# Patient Record
Sex: Female | Born: 1970 | Race: White | Hispanic: No | Marital: Married | Smoking: Never smoker
Health system: Southern US, Community
[De-identification: ages and names within clinical notes are randomized; demographics above are authoritative.]

## PROBLEM LIST (undated history)

## (undated) HISTORY — PX: OTHER SURGICAL HISTORY: SHX169

---

## 2011-07-05 HISTORY — PX: DILATION AND CURETTAGE OF UTERUS: SHX78

## 2012-11-01 DIAGNOSIS — O021 Missed abortion: Secondary | ICD-10-CM | POA: Insufficient documentation

## 2013-05-17 DIAGNOSIS — R002 Palpitations: Secondary | ICD-10-CM | POA: Insufficient documentation

## 2013-05-17 DIAGNOSIS — K219 Gastro-esophageal reflux disease without esophagitis: Secondary | ICD-10-CM | POA: Insufficient documentation

## 2013-05-17 DIAGNOSIS — Z3490 Encounter for supervision of normal pregnancy, unspecified, unspecified trimester: Secondary | ICD-10-CM | POA: Insufficient documentation

## 2014-08-12 ENCOUNTER — Emergency Department: Payer: Self-pay | Admitting: Student

## 2014-09-26 ENCOUNTER — Emergency Department: Payer: Self-pay | Admitting: Emergency Medicine

## 2015-04-08 ENCOUNTER — Encounter: Payer: Self-pay | Admitting: *Deleted

## 2015-04-08 DIAGNOSIS — N200 Calculus of kidney: Secondary | ICD-10-CM | POA: Insufficient documentation

## 2015-04-09 ENCOUNTER — Ambulatory Visit: Payer: Self-pay | Admitting: Obstetrics and Gynecology

## 2015-04-24 ENCOUNTER — Encounter: Payer: Self-pay | Admitting: Obstetrics and Gynecology

## 2015-04-24 ENCOUNTER — Ambulatory Visit: Payer: Self-pay | Admitting: Obstetrics and Gynecology

## 2015-09-11 ENCOUNTER — Encounter: Payer: Self-pay | Admitting: Emergency Medicine

## 2015-09-11 DIAGNOSIS — Z3202 Encounter for pregnancy test, result negative: Secondary | ICD-10-CM | POA: Insufficient documentation

## 2015-09-11 DIAGNOSIS — R1030 Lower abdominal pain, unspecified: Secondary | ICD-10-CM | POA: Diagnosis present

## 2015-09-11 DIAGNOSIS — N3090 Cystitis, unspecified without hematuria: Secondary | ICD-10-CM | POA: Insufficient documentation

## 2015-09-11 LAB — COMPREHENSIVE METABOLIC PANEL
ALBUMIN: 3.7 g/dL (ref 3.5–5.0)
ALK PHOS: 92 U/L (ref 38–126)
ALT: 26 U/L (ref 14–54)
AST: 28 U/L (ref 15–41)
Anion gap: 6 (ref 5–15)
BUN: 12 mg/dL (ref 6–20)
CALCIUM: 8.8 mg/dL — AB (ref 8.9–10.3)
CHLORIDE: 107 mmol/L (ref 101–111)
CO2: 24 mmol/L (ref 22–32)
CREATININE: 0.83 mg/dL (ref 0.44–1.00)
GFR calc non Af Amer: >60 mL/min (ref >60)
GLUCOSE: 136 mg/dL — AB (ref 65–99)
Potassium: 3.6 mmol/L (ref 3.5–5.1)
SODIUM: 137 mmol/L (ref 135–145)
Total Bilirubin: 0.5 mg/dL (ref 0.3–1.2)
Total Protein: 7.4 g/dL (ref 6.5–8.1)

## 2015-09-11 LAB — URINALYSIS COMPLETE WITH MICROSCOPIC (ARMC ONLY)
BILIRUBIN URINE: NEGATIVE
GLUCOSE, UA: NEGATIVE mg/dL
Ketones, ur: NEGATIVE mg/dL
Leukocytes, UA: NEGATIVE
Nitrite: NEGATIVE
Protein, ur: NEGATIVE mg/dL
Specific Gravity, Urine: 1.002 — ABNORMAL LOW (ref 1.005–1.030)
pH: 6 (ref 5.0–8.0)

## 2015-09-11 LAB — LIPASE, BLOOD: LIPASE: 34 U/L (ref 11–51)

## 2015-09-11 LAB — CBC
HEMATOCRIT: 37.9 % (ref 35.0–47.0)
HEMOGLOBIN: 13 g/dL (ref 12.0–16.0)
MCH: 29.1 pg (ref 26.0–34.0)
MCHC: 34.4 g/dL (ref 32.0–36.0)
MCV: 84.6 fL (ref 80.0–100.0)
Platelets: 290 10*3/uL (ref 150–440)
RBC: 4.48 MIL/uL (ref 3.80–5.20)
RDW: 14 % (ref 11.5–14.5)
WBC: 8.8 10*3/uL (ref 3.6–11.0)

## 2015-09-11 NOTE — ED Notes (Signed)
Pt. States she has been seen recently for UTI like symptoms.  Pt. States she was advised to see urologist, but does not have appt. Till next week.  Pt. States she had been having lower abdominal spasms that radiated to back.  Pt. States today, burning sensation while urinating.   Pt. States dx of kidney stones last year.

## 2015-09-12 ENCOUNTER — Emergency Department
Admission: EM | Admit: 2015-09-12 | Discharge: 2015-09-12 | Disposition: A | Discharge status: Home / Self Care | Payer: Federal, State, Local not specified - PPO | Attending: Emergency Medicine | Admitting: Emergency Medicine

## 2015-09-12 DIAGNOSIS — N3090 Cystitis, unspecified without hematuria: Secondary | ICD-10-CM

## 2015-09-12 LAB — PREGNANCY, URINE: PREG TEST UR: NEGATIVE

## 2015-09-12 MED ORDER — PHENAZOPYRIDINE HCL 95 MG PO TABS: 190.0000 mg | ORAL_TABLET | Freq: Three times a day (TID) | ORAL | Status: DC | PRN

## 2015-09-12 NOTE — Discharge Instructions (Signed)
As we discussed, your workup today was reassuring.  We believe he would benefit from follow-up with a urologist.  We have sent a message to [facility]olin[facility] at [facility].  If you have not heard from her office by Tuesday, please call to see if he can schedule an appointment for this coming week.  Try taking the prescribed medication for your bladder discomfort as well as over-the-counter ibuprofen and/or Tylenol according to label instructions.  Return to the emergency department with new or worsening symptoms that concern you.

## 2015-09-12 NOTE — ED Provider Notes (Signed)
[facility] Emergency Department Provider Note  ____________________________________________  Time seen: Approximately 1:30 AM  I have reviewed the triage vital signs and the nursing notes.   HISTORY  Chief Complaint Abdominal Pain    HPI Lindsey Gillespie is a 45 y.o. female who has a history of hematuria who presents with suprapubic or pelvic pain.  This is been going on for several weeks and she has seen her outpatient doctor and also has an appointment scheduled next week to see her primary care doctor again.  She has not been able to see a urologist.  She reports acute onset of severe suprapubic discomfort tonight that she describes as a pressure and irritation but not sharp or stabbing pain or burning.  Denies N/V.  No flank pain.  Denies fever/chills, CP, SOB.  Denies vaginal symptoms.  Nothing makes better or worse.    History reviewed. No pertinent past medical history.  Patient Active Problem List   Diagnosis Date Noted  . Calculus of kidney 04/08/2015    Past Surgical History  Procedure Laterality Date  . Miscarriage      Current Outpatient Rx  Name  Route  Sig  Dispense  Refill  . phenazopyridine (PYRIDIUM) 95 MG tablet   Oral   Take 2 tablets (190 mg total) by mouth 3 (three) times daily with meals as needed for pain (bladder pain/spasms).   12 tablet   0     Allergies Metronidazole and Tape  No family history on file.  Social History Social History  Substance Use Topics  . Smoking status: Never Smoker   . Smokeless tobacco: None  . Alcohol Use: No    Review of Systems Constitutional: No fever/chills Eyes: No visual changes. ENT: No sore throat. Cardiovascular: Denies chest pain. Respiratory: Denies shortness of breath. Gastrointestinal: Suprapubic pain Genitourinary: Negative for dysuria.  H/o hematuria Musculoskeletal: Negative for back pain. Skin: Negative for rash. Neurological: Negative for headaches, focal weakness  or numbness.  10-point ROS otherwise negative.  ____________________________________________   PHYSICAL EXAM:  VITAL SIGNS: ED Triage Vitals  Enc Vitals Group     BP 09/11/15 2128 131/91 mmHg     Pulse Rate 09/11/15 2128 77     Resp 09/11/15 2128 16     Temp 09/11/15 2128 98.3 F (36.8 C)     Temp src --      SpO2 09/11/15 2128 99 %     Weight 09/11/15 2128 196 lb (88.905 kg)     Height 09/11/15 2128  (1.626 m)     Head Cir --      Peak Flow --      Pain Score 09/11/15 2130 8     Pain Loc --      Pain Edu? --      Excl. in GC? --     Constitutional: Alert and oriented. Well appearing and in no acute distress. Eyes: Conjunctivae are normal. PERRL. EOMI. Head: Atraumatic. Nose: No congestion/rhinnorhea. Mouth/Throat: Mucous membranes are moist.  Oropharynx non-erythematous. Neck: No stridor.  No meningeal signs.   Cardiovascular: Normal rate, regular rhythm. Good peripheral circulation. Grossly normal heart sounds.   Respiratory: Normal respiratory effort.  No retractions. Lungs CTAB. Gastrointestinal: Soft and nontender. No distention.  GU:  Declined by patient Musculoskeletal: No lower extremity tenderness nor edema. No gross deformities of extremities. Neurologic:  Normal speech and language. No gross focal neurologic deficits are appreciated.  Skin:  Skin is warm, dry and intact. No rash noted.  ____________________________________________   LABS (all labs ordered are listed, but only abnormal results are displayed)  Labs Reviewed  COMPREHENSIVE METABOLIC PANEL - Abnormal; Notable for the following:    Glucose, Bld 136 (*)    Calcium 8.8 (*)    All other components within normal limits  URINALYSIS COMPLETEWITH MICROSCOPIC ([facility] ONLY) - Abnormal; Notable for the following:    Color, Urine STRAW (*)    APPearance CLEAR (*)    Specific Gravity, Urine 1.002 (*)    Hgb urine dipstick 3+ (*)    Bacteria, UA RARE (*)    Squamous Epithelial / LPF 0-5 (*)     All other components within normal limits  URINE CULTURE  LIPASE, BLOOD  CBC  PREGNANCY, URINE   ____________________________________________  EKG  ED ECG REPORT I, Anterio Scheel, the attending physician, personally viewed and interpreted this ECG.  Date: 09/12/2015 EKG Time: 21:27 Rate: 78 Rhythm: normal sinus rhythm QRS Axis: normal Intervals: normal ST/T Wave abnormalities: normal Conduction Disturbances: none Narrative Interpretation: unremarkable  ____________________________________________  RADIOLOGY   No results found.  ____________________________________________   PROCEDURES  Procedure(s) performed: None  Critical Care performed: No ____________________________________________   INITIAL IMPRESSION / ASSESSMENT AND PLAN / ED COURSE  Pertinent labs & imaging results that were available during my care of the patient were reviewed by me and considered in my medical decision making (see chart for details).  Signs/symptoms most consistent with inflammatory (rather than infectious) cystitis.  UA unremarkable except for Hgb.   Sent urine culture to be sure.  Discussed pelvic, which patient declined.  Also discussed CT renal study protocol, but I explained I did not think it was necessary, and she agreed.  I discussed cystitis and explained why I thought f/u w/ urology would be the best option, and I [facility] to Dr. Apolinar JunesBrandon to facilitate f/u.  Rxs as listed below.    I gave my usual and customary return precautions.     ____________________________________________  FINAL CLINICAL IMPRESSION(S) / ED DIAGNOSES  Final diagnoses:  Cystitis      NEW MEDICATIONS STARTED DURING THIS VISIT:  Discharge Medication List as of 09/12/2015  1:53 AM    START taking these medications   Details  phenazopyridine (PYRIDIUM) 95 MG tablet Take 2 tablets (190 mg total) by mouth 3 (three) times daily with meals as needed for pain (bladder pain/spasms).,  Starting 09/12/2015, Until Discontinued, Print          Note:  This document was prepared using Dragon voice recognition software and may include unintentional dictation errors.   Loleta Roseory Avenly Roberge, MD 09/12/15 [phone]

## 2015-09-12 NOTE — ED Notes (Signed)
Pt's husband called after pt's discharge, states pt is doubled over in pain.  Dr. York CeriseForbach consulted by this nurse.  Based on Dr. York CeriseForbach feedback, pt's husband told pain control options at home include OTC tylenol, ibuprofen, or Azo.  Husband told he can bring pt back to ED if pt feels that is necessary, but that decision cannot be made by Dr. York CeriseForbach or this nurse.

## 2015-09-14 LAB — URINE CULTURE
CULTURE: NO GROWTH
Special Requests: NORMAL

## 2015-10-02 ENCOUNTER — Encounter: Payer: Self-pay | Admitting: Urology

## 2015-10-02 ENCOUNTER — Ambulatory Visit (INDEPENDENT_AMBULATORY_CARE_PROVIDER_SITE_OTHER): Payer: Federal, State, Local not specified - PPO | Admitting: Urology

## 2015-10-02 VITALS — BP 110/77 | HR 76 | Ht 64.0 in | Wt 200.8 lb

## 2015-10-02 DIAGNOSIS — N200 Calculus of kidney: Secondary | ICD-10-CM

## 2015-10-02 DIAGNOSIS — F32A Depression, unspecified: Secondary | ICD-10-CM | POA: Insufficient documentation

## 2015-10-02 DIAGNOSIS — R102 Pelvic and perineal pain: Secondary | ICD-10-CM | POA: Diagnosis not present

## 2015-10-02 DIAGNOSIS — F329 Major depressive disorder, single episode, unspecified: Secondary | ICD-10-CM | POA: Insufficient documentation

## 2015-10-02 DIAGNOSIS — N393 Stress incontinence (female) (male): Secondary | ICD-10-CM

## 2015-10-02 LAB — URINALYSIS, COMPLETE
Bilirubin, UA: NEGATIVE
GLUCOSE, UA: NEGATIVE
Ketones, UA: NEGATIVE
LEUKOCYTES UA: NEGATIVE
Nitrite, UA: NEGATIVE
PH UA: 7 (ref 5.0–7.5)
PROTEIN UA: NEGATIVE
Specific Gravity, UA: 1.01 (ref 1.005–1.030)
Urobilinogen, Ur: 0.2 mg/dL (ref 0.2–1.0)

## 2015-10-02 LAB — MICROSCOPIC EXAMINATION: RBC, UA: NONE SEEN /hpf (ref 0–?)

## 2015-10-02 LAB — BLADDER SCAN AMB NON-IMAGING: Scan Result: 115

## 2015-10-02 MED ORDER — URIBEL 118 MG PO CAPS: 1.0000 | ORAL_CAPSULE | Freq: Four times a day (QID) | ORAL | Status: DC | PRN

## 2015-10-02 NOTE — Progress Notes (Signed)
10/02/2015 9:45 AM   Myles Lipps March 29, 1971 [id number]  Referring provider: No referring provider defined for this encounter.  Chief Complaint  Patient presents with  . New Patient (Initial Visit)    hematuria who presents with suprapubic or pelvic pain    HPI:  45 year old female who presents today for further evaluation of suprapubic pelvic pain.  She developed suprapubic discomfort with spasms every `15 seconds with associated by burning in the lower abdomen.  Her pain was relieved by voiding.  The pain lasted about 1 week and ultimately resolved.  No associated urinary and frequency.  Her pain did improve with AZO.   She does note that before her symptoms started, she did eat Clementeen Graham and has since been avoiding spicy and irritating.  She was seen for this issue in the emergency room on 09/11/2015. At that time, her UA was completely negative other than 2+ blood on dipstick. Microscopic exam showed no evidence of red blood cells. There is no evidence of infection.  She has single episode of cystitis many years ago but states it was different.  At that time, her pain was associated with urinary frequency, urgency, and dysuria more typical of bacterial cystitis.  She does have a history of nephrolithiasis. Her last imaging was 08/12/2014 which showed a 1 mm calculus in the mid left kidney but otherwise no pathology on CT stone protocol.  No recent flank pain.  She denies a personal history of passing a stone which she is aware.    She does have mild SUI with occational pad.  She also has new urgency since her episode described above.     PMH: History reviewed. No pertinent past medical history.  Surgical History: Past Surgical History  Procedure Laterality Date  . Miscarriage    . Dilation and curettage of uterus  2013    Home Medications:    Medication List       This list is accurate as of: 10/02/15  9:45 AM.  Always use your most recent med list.               phenazopyridine 95 MG tablet  Commonly known as:  PYRIDIUM  Take 2 tablets (190 mg total) by mouth 3 (three) times daily with meals as needed for pain (bladder pain/spasms).     RA TURMERIC 500 MG Caps  Generic drug:  Turmeric  Take by mouth.        Allergies:  Allergies  Allergen Reactions  . Metronidazole Swelling    Other reaction(s): SWELLING/EDEMA  . Tape Other (See Comments)    Other reaction(s): HIVES    Family History: Family History  Problem Relation Age of Onset  . Hematuria Neg Hx     Social History:  reports that she has never smoked. She does not have any smokeless tobacco history on file. She reports that she does not drink alcohol. Her drug history is not on file.  ROS: UROLOGY Frequent Urination?: No Hard to postpone urination?: No Burning/pain with urination?: No Get up at night to urinate?: No Leakage of urine?: Yes Urine stream starts and stops?: No Trouble starting stream?: No Do you have to strain to urinate?: No Blood in urine?: No Urinary tract infection?: Yes Sexually transmitted disease?: No Injury to kidneys or bladder?: No Painful intercourse?: No Weak stream?: No Currently pregnant?: No Vaginal bleeding?: No Last menstrual period?: n  Gastrointestinal Nausea?: Yes Vomiting?: No Indigestion/heartburn?: No Diarrhea?: No Constipation?: No  Constitutional Fever: No Night sweats?:  No Weight loss?: No Fatigue?: Yes  Skin Skin rash/lesions?: No Itching?: No  Eyes Blurred vision?: No Double vision?: No  Ears/Nose/Throat Sore throat?: Yes Sinus problems?: No  Hematologic/Lymphatic Swollen glands?: No Easy bruising?: No  Cardiovascular Leg swelling?: No Chest pain?: No  Respiratory Cough?: No Shortness of breath?: Yes  Endocrine Excessive thirst?: No  Musculoskeletal Back pain?: No Joint pain?: No  Neurological Headaches?: Yes Dizziness?: No  Psychologic Depression?: No Anxiety?: No  Physical  Exam: BP 110/77 mmHg  Pulse 76  Ht  (1.626 m)  Wt 200 lb 12.8 oz (91.082 kg)  BMI 34.45 kg/m2  LMP 08/22/2015 (Approximate)  Constitutional:  Alert and oriented, No acute distress. HEENT: [place] AT, moist mucus membranes.  Trachea midline, no masses. Cardiovascular: No clubbing, cyanosis, or edema. Respiratory: Normal respiratory effort, no increased work of breathing. GI: Abdomen is soft, nontender, nondistended, no abdominal masses.  Obese.   GU: No CVA tenderness.  Skin: No rashes, bruises or suspicious lesions. Neurologic: Grossly intact, no focal deficits, moving all 4 extremities. Psychiatric: Normal mood and affect.  Laboratory Data: Lab Results  Component Value Date   WBC 8.8 09/11/2015   HGB 13.0 09/11/2015   HCT 37.9 09/11/2015   MCV 84.6 09/11/2015   PLT 290 09/11/2015    Lab Results  Component Value Date   CREATININE 0.83 09/11/2015     Urinalysis Results for orders placed or performed in visit on 10/02/15  Microscopic Examination  Result Value Ref Range   WBC, UA 0-5 0 -  5 /hpf   RBC, UA None seen 0 -  2 /hpf   Epithelial Cells (non renal) 0-10 0 - 10 /hpf   Bacteria, UA Few (A) None seen/Few  Urinalysis, Complete  Result Value Ref Range   Specific Gravity, UA 1.010 1.005 - 1.030   pH, UA 7.0 5.0 - 7.5   Color, UA Yellow Yellow   Appearance Ur Clear Clear   Leukocytes, UA Negative Negative   Protein, UA Negative Negative/Trace   Glucose, UA Negative Negative   Ketones, UA Negative Negative   RBC, UA 1+ (A) Negative   Bilirubin, UA Negative Negative   Urobilinogen, Ur 0.2 0.2 - 1.0 mg/dL   Nitrite, UA Negative Negative   Microscopic Examination See below:   Bladder Scan (Post Void Residual) in office  Result Value Ref Range   Scan Result 115    BLADDER SCAN PREVOID!!!  Pertinent Imaging: CLINICAL DATA: Lower abdominal and back pain. Nausea   EXAM:  CT ABDOMEN AND PELVIS WITHOUT CONTRAST   TECHNIQUE:  Multidetector CT imaging  of the abdomen and pelvis was performed  following the standard protocol without oral or intravenous contrast  material administration.   COMPARISON: None.   FINDINGS:  On axial slice 12 series 4, there is a 3 mm nodular opacity in the  lateral segment of the left lower lobe. Lung bases are otherwise  clear.   There is a 1 cm cyst in the medial segment of the left lobe of the  liver. No other focal liver lesions are identified on this  noncontrast enhanced study. The gallbladder wall is not appreciably  thickened. There is no biliary duct dilatation.   Spleen, pancreas, and adrenals appear normal.   There is a 1 mm calculus in the mid left kidney. There is no other  intrarenal calculus. There is no hydronephrosis or mass in either  kidney. There is no ureteral calculus on either side.   In the pelvis,  urinary bladder is midline with normal wall  thickness. There is no pelvic mass or pelvic fluid collection.  Appendix appears normal.   There is no bowel obstruction. No free air or portal venous air.  There is no ascites, adenopathy, or abscess in the abdomen or  pelvis. There is no abdominal aortic aneurysm. There are no blastic  or lytic bone lesions.   IMPRESSION:  1 mm calculus mid left kidney. No hydronephrosis or ureteral  calculus on either side.   Appendix appears normal. No bowel obstruction. No abscess.    Electronically Signed   By: Bretta BangWilliam Woodruff III M.D.   On: 08/12/2014 13:40         Assessment & Plan:    1. Pelvic pain in female Description of suprapubic, pelvic pain relieved with voiding as somewhat concerning for interstitial cystitis. Her symptoms of since resolved.     We discussed the diease pathophysiology is poorly understood therefore treatment has been focused primarily on symptomatic relief as well as dietary and behavioral modification.  IC dietary information also given today as many  patients experience relief with simple lifestyle modifications. We also discussed intermittent use of over the counter pyridium (no greater than 3 days at at time) for intermittent relief.  She was also prescribed. Using balloon which could be used on a more chronic basis should her symptoms recur.  If conservative management fails, will consider further work up with cystoscopy to access for Hunter's ulcers or other more aggressive treatments, however, response to each of these interventions is highly variable.   - Urinalysis, Complete - Bladder Scan (Post Void Residual) in office  2. Kidney stone on left side 1 mm left sided stone Recommend observation Discussed stone diet  3. SUI (stress urinary incontinence, female) Mild.  Discussed Kegels and weight loss.   Return as needed for recurrent symptoms   Vanna ScotlandAshley Arnice Vanepps, MD  [facility] [address], [address] [place], [place] [address] [phone]

## 2016-01-24 IMAGING — CT CT ABD-PELV W/O CM
3 of 4 series · 9 of 46 positions shown, 16 images · non-contrast
Comparison: None.

CLINICAL DATA: Lower abdominal and back pain.  Nausea

EXAM:
CT ABDOMEN AND PELVIS WITHOUT CONTRAST
TECHNIQUE: Multidetector CT imaging of the abdomen and pelvis was performed
following the standard protocol without oral or intravenous contrast
material administration.

[Series 4: lung · axial · 0.81mm/px · z∈[-223,-143]mm · 5 of 26 slices shown, 10 images]
[im 5/26  soft-tissue]
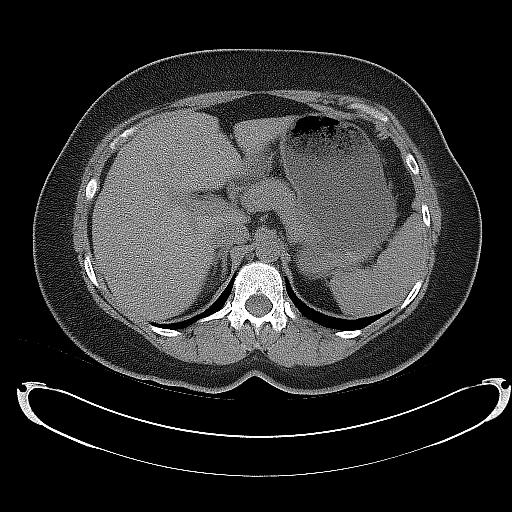
[im 5/26  bone]
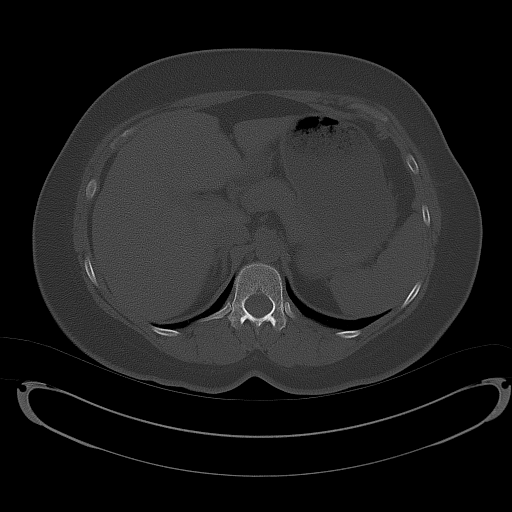
[im 9/26  soft-tissue]
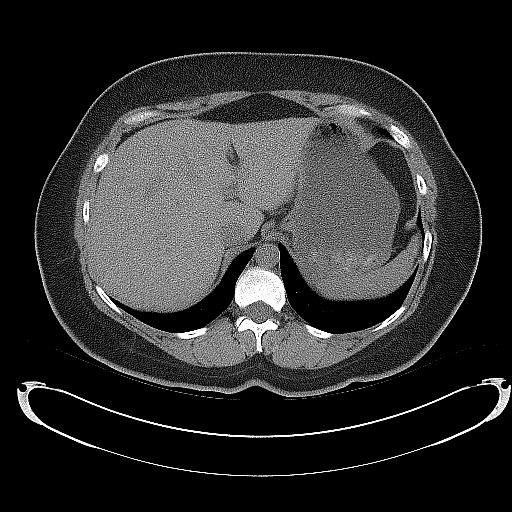
[im 9/26  lung]
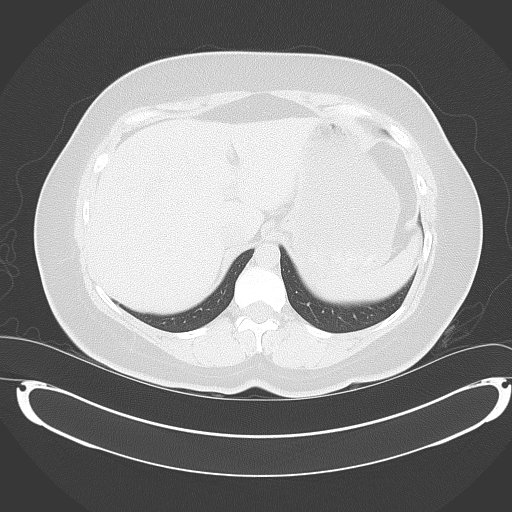
[im 13/26  soft-tissue]
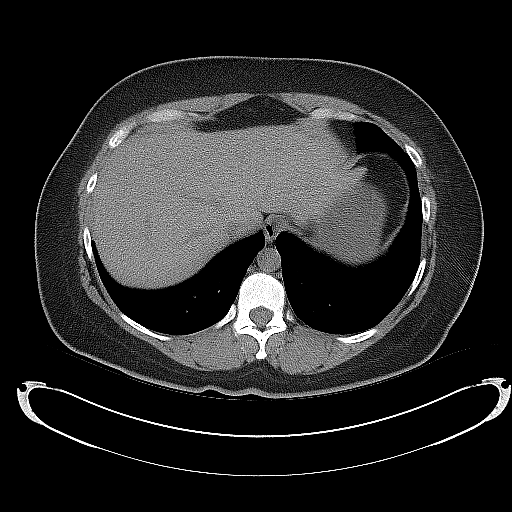
[im 13/26  lung]
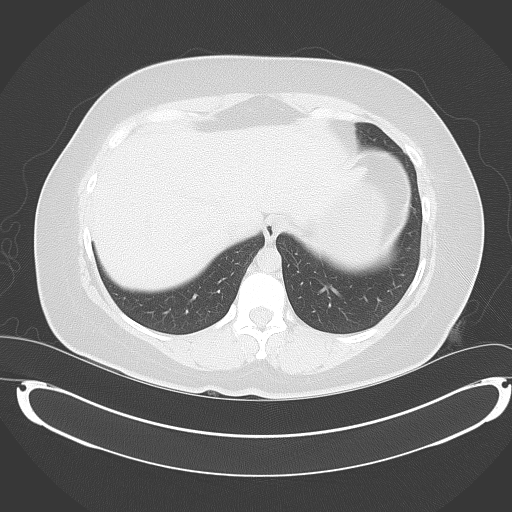
[im 17/26  soft-tissue]
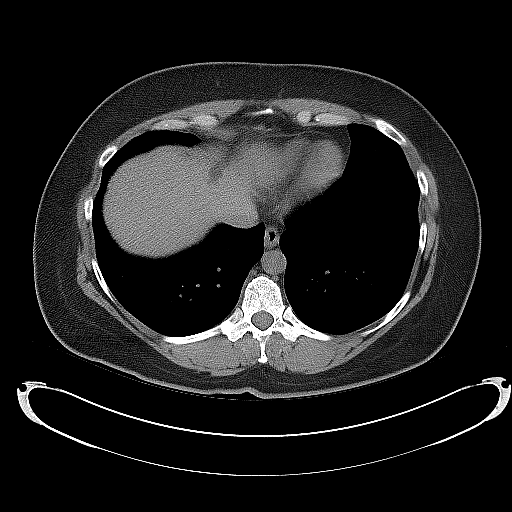
[im 17/26  lung]
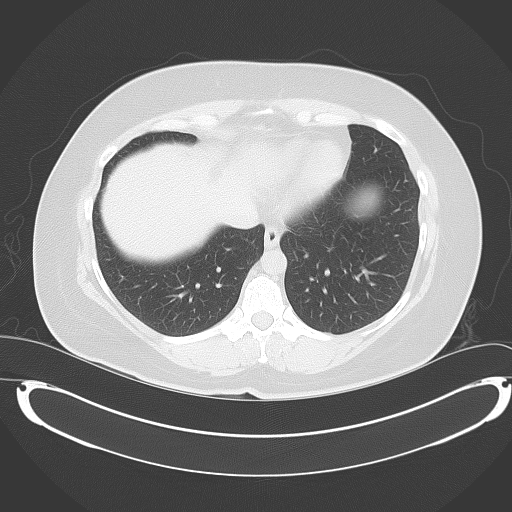
[im 21/26  soft-tissue]
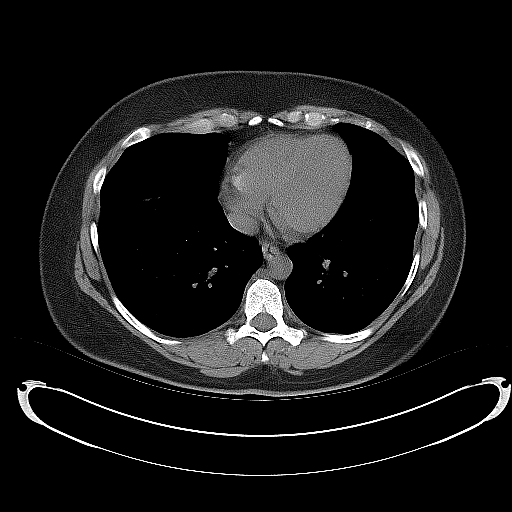
[im 21/26  lung]
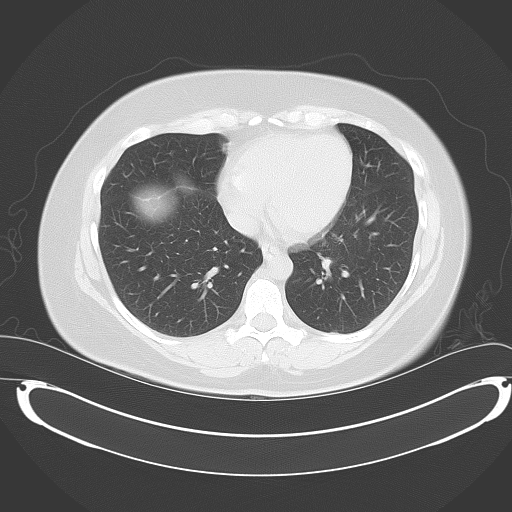

[Series 5: coronal · coronal · 0.92mm/px · 3 of 140 slices shown, 4 images]
[im 47/140  soft-tissue]
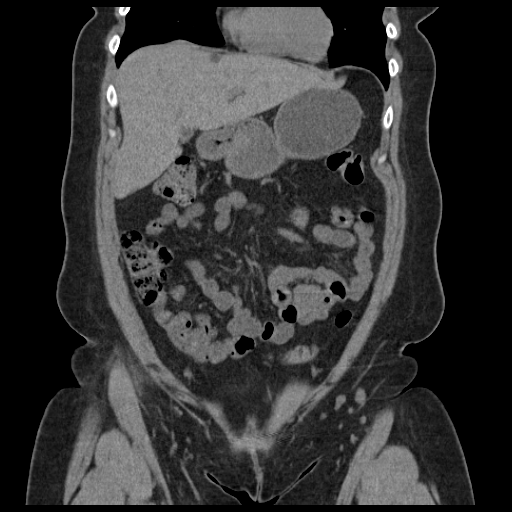
[im 62/140  soft-tissue]
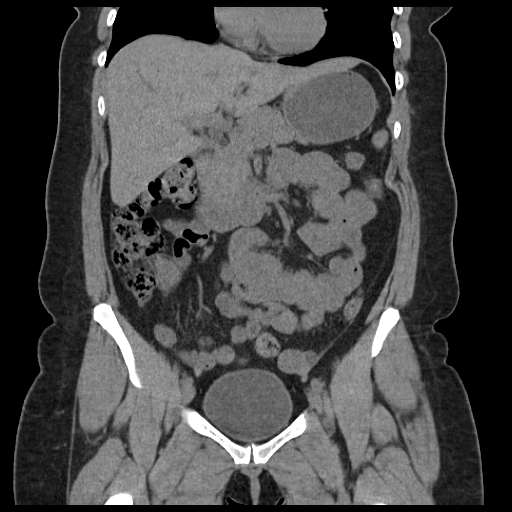
[im 62/140  bone]
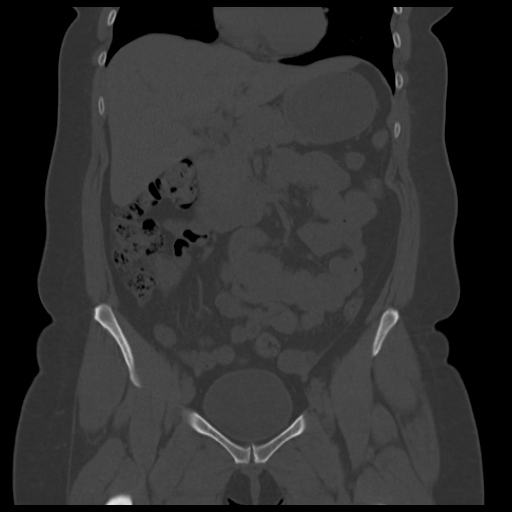
[im 78/140  soft-tissue]
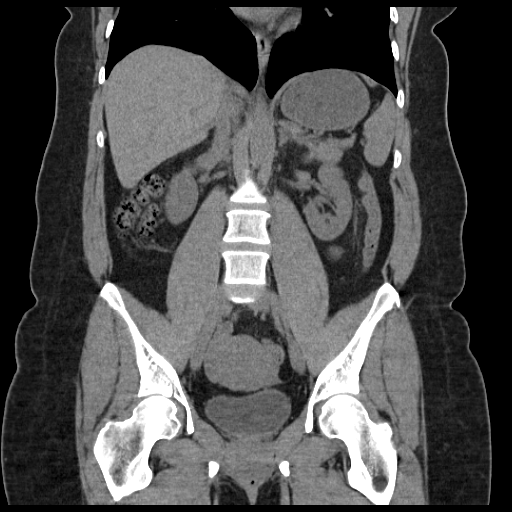

[Series 6: sagittal · sagittal · 0.92mm/px · 1 of 202 slices shown, 2 images]
[im 68/202  soft-tissue]
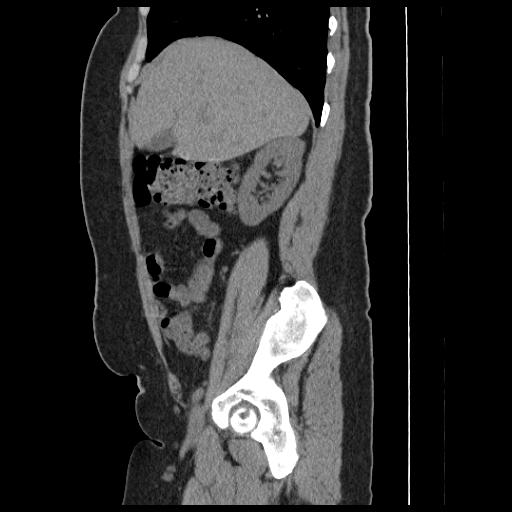
[im 68/202  bone]
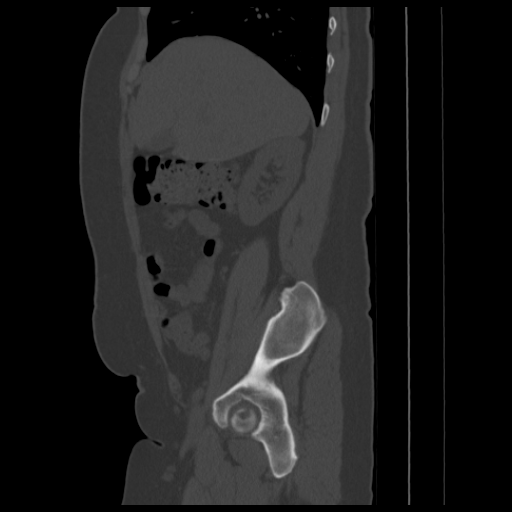

[9 of 46 positions shown; findings below may reference images not displayed]

FINDINGS: On axial slice 12 series 4, there is a 3 mm nodular opacity in the
lateral segment of the left lower lobe. Lung bases are otherwise
clear.

There is a 1 cm cyst in the medial segment of the left lobe of the
liver. No other focal liver lesions are identified on this
noncontrast enhanced study. The gallbladder wall is not appreciably
thickened. There is no biliary duct dilatation.

Spleen, pancreas, and adrenals appear normal.

There is a 1 mm calculus in the mid left kidney. There is no other
intrarenal calculus. There is no hydronephrosis or mass in either
kidney. There is no ureteral calculus on either side.

In the pelvis, urinary bladder is midline with normal wall
thickness. There is no pelvic mass or pelvic fluid collection.
Appendix appears normal.

There is no bowel obstruction. No free air or portal venous air.
There is no ascites, adenopathy, or abscess in the abdomen or
pelvis. There is no abdominal aortic aneurysm. There are no blastic
or lytic bone lesions.
IMPRESSION: 1 mm calculus mid left kidney. No hydronephrosis or ureteral
calculus on either side.

Appendix appears normal.  No bowel obstruction.  No abscess.

## 2016-03-16 ENCOUNTER — Emergency Department
Admission: EM | Admit: 2016-03-16 | Discharge: 2016-03-16 | Disposition: A | Discharge status: Home / Self Care | Payer: 59 | Attending: Emergency Medicine | Admitting: Emergency Medicine

## 2016-03-16 ENCOUNTER — Encounter: Payer: Self-pay | Admitting: Emergency Medicine

## 2016-03-16 ENCOUNTER — Emergency Department: Payer: 59

## 2016-03-16 DIAGNOSIS — R002 Palpitations: Secondary | ICD-10-CM

## 2016-03-16 DIAGNOSIS — M549 Dorsalgia, unspecified: Secondary | ICD-10-CM | POA: Diagnosis not present

## 2016-03-16 DIAGNOSIS — R42 Dizziness and giddiness: Secondary | ICD-10-CM | POA: Diagnosis not present

## 2016-03-16 LAB — URINALYSIS COMPLETE WITH MICROSCOPIC (ARMC ONLY)
BILIRUBIN URINE: NEGATIVE
Bacteria, UA: NONE SEEN
GLUCOSE, UA: NEGATIVE mg/dL
KETONES UR: NEGATIVE mg/dL
Leukocytes, UA: NEGATIVE
NITRITE: NEGATIVE
Protein, ur: NEGATIVE mg/dL
SPECIFIC GRAVITY, URINE: 1.006 (ref 1.005–1.030)
Squamous Epithelial / LPF: NONE SEEN
pH: 7 (ref 5.0–8.0)

## 2016-03-16 LAB — CBC
HCT: 41.2 % (ref 35.0–47.0)
Hemoglobin: 14 g/dL (ref 12.0–16.0)
MCH: 29.3 pg (ref 26.0–34.0)
MCHC: 33.9 g/dL (ref 32.0–36.0)
MCV: 86.5 fL (ref 80.0–100.0)
PLATELETS: 298 10*3/uL (ref 150–440)
RBC: 4.77 MIL/uL (ref 3.80–5.20)
RDW: 13.3 % (ref 11.5–14.5)
WBC: 9.1 10*3/uL (ref 3.6–11.0)

## 2016-03-16 LAB — BASIC METABOLIC PANEL
ANION GAP: 5 (ref 5–15)
BUN: 10 mg/dL (ref 6–20)
CALCIUM: 9 mg/dL (ref 8.9–10.3)
CO2: 27 mmol/L (ref 22–32)
Chloride: 106 mmol/L (ref 101–111)
Creatinine, Ser: 0.91 mg/dL (ref 0.44–1.00)
GFR calc Af Amer: >60 mL/min (ref >60)
GLUCOSE: 92 mg/dL (ref 65–99)
Potassium: 4.1 mmol/L (ref 3.5–5.1)
SODIUM: 138 mmol/L (ref 135–145)

## 2016-03-16 LAB — TROPONIN I

## 2016-03-16 NOTE — ED Provider Notes (Signed)
[facility] Emergency Department Provider Note   ____________________________________________   First MD Initiated Contact with Patient 03/16/16 1821     (approximate)  I have reviewed the triage vital signs and the nursing notes.   HISTORY  Chief Complaint Palpitations   HPI Lindsey Gillespie is a 45 y.o. female with a history of palpitations who is presenting to the emergency department with palpitations. She says that they've worsened over the past week although she has been having palpitations for most of her adult life. She says that she has been experiencing significant stress from her job and often feels these palpitations the day after lasting several seconds. She says that this week they have worsened and have been associated with mild episodes of dizziness as well as central back pain which she says feels like a pressure-type pain and last for several minutes. She is denying any symptoms at this time. Does not take any hormone supplementation or birth control.   History reviewed. No pertinent past medical history.  Patient Active Problem List   Diagnosis Date Noted  . Clinical depression 10/02/2015  . Calculus of kidney 04/08/2015  . Acid reflux 05/17/2013  . Awareness of heartbeats 05/17/2013  . Currently pregnant 05/17/2013  . Abortion, spontaneous 11/01/2012    Past Surgical History:  Procedure Laterality Date  . DILATION AND CURETTAGE OF UTERUS  2013  . miscarriage      Prior to Admission medications   Medication Sig Start Date End Date Taking? Authorizing Provider  Meth-Hyo-M Bl-Na Phos-Ph Sal (URIBEL) 118 MG CAPS Take 1 capsule (118 mg total) by mouth 4 (four) times daily as needed. 10/02/15   Vanna Scotland, MD  phenazopyridine (PYRIDIUM) 95 MG tablet Take 2 tablets (190 mg total) by mouth 3 (three) times daily with meals as needed for pain (bladder pain/spasms). 09/12/15   Loleta Rose, MD  Turmeric (RA TURMERIC) 500 MG CAPS Take by  mouth.    Historical Provider, MD    Allergies Metronidazole and Tape  Family History  Problem Relation Age of Onset  . Hematuria Neg Hx     Social History Social History  Substance Use Topics  . Smoking status: Never Smoker  . Smokeless tobacco: Not on file  . Alcohol use No    Review of Systems Constitutional: No fever/chills Eyes: No visual changes. ENT: No sore throat. Cardiovascular: Denies chest pain. Respiratory: Denies shortness of breath. Gastrointestinal: No abdominal pain.  No nausea, no vomiting.  No diarrhea.  No constipation. Genitourinary: Negative for dysuria. Musculoskeletal: as above.  Skin: Negative for rash. Neurological: Negative for headaches, focal weakness or numbness.  10-point ROS otherwise negative.  ____________________________________________   PHYSICAL EXAM:  VITAL SIGNS: ED Triage Vitals  Enc Vitals Group     BP 03/16/16 1627 136/84     Pulse Rate 03/16/16 1627 71     Resp 03/16/16 1627 17     Temp 03/16/16 1627 98.2 F (36.8 C)     Temp Source 03/16/16 1627 Oral     SpO2 03/16/16 1627 99 %     Weight 03/16/16 1628 194 lb (88 kg)     Height 03/16/16 1628 5\' 4"  (1.626 m)     Head Circumference --      Peak Flow --      Pain Score 03/16/16 1635 0     Pain Loc --      Pain Edu? --      Excl. in GC? --  Constitutional: Alert and oriented. Well appearing and in no acute distress. Eyes: Conjunctivae are normal. PERRL. EOMI. Head: Atraumatic. Nose: No congestion/rhinnorhea. Mouth/Throat: Mucous membranes are moist.   Neck: No stridor.   Cardiovascular: Normal rate, regular rhythm. Grossly normal heart sounds.  Good peripheral circulation with equal and bilateral radial pulses.   Respiratory: Normal respiratory effort.  No retractions. Lungs CTAB. Gastrointestinal: Soft and nontender. No distention.  Musculoskeletal: No lower extremity tenderness nor edema.  No joint effusions. Neurologic:  Normal speech and language. No  gross focal neurologic deficits are appreciated.  Skin:  Skin is warm, dry and intact. No rash noted. Psychiatric: Mood and affect are normal. Speech and behavior are normal.  ____________________________________________   LABS (all labs ordered are listed, but only abnormal results are displayed)  Labs Reviewed  URINALYSIS COMPLETEWITH MICROSCOPIC ([facility] ONLY) - Abnormal; Notable for the following:       Result Value   Color, Urine STRAW (*)    APPearance CLEAR (*)    Hgb urine dipstick 2+ (*)    All other components within normal limits  BASIC METABOLIC PANEL  CBC  TROPONIN I   ____________________________________________  EKG  ED ECG REPORT I, Arelia LongestSchaevitz,  Serine Kea M, the attending physician, personally viewed and interpreted this ECG.   Date: 03/16/2016  EKG Time: 1633  Rate: 72  Rhythm: normal sinus rhythm  Axis: Normal  Intervals:none  ST&T Change: No ST segment elevation or depression. No abnormal T-wave inversion  ____________________________________________  RADIOLOGY  DG Chest 2 View (Accession [id number]) (Order [id number])  Imaging  Date: 03/16/2016 Department: [facility] EMERGENCY DEPARTMENT Released By: Lorriane ShireKatie L Newsholme, RN (auto-released) Authorizing: Myrna Blazeravid Matthew Maneh Sieben, MD  PACS Images   Show images for DG Chest 2 View  Study Result   CLINICAL DATA:  Intermittent heart palpitations for last 4 years. Worsening last week. Dizziness. Smoking history.  EXAM: CHEST  2 VIEW  COMPARISON:  None.  FINDINGS: Heart size is normal. Mediastinal shadows are normal. The lungs are clear. No effusions. No bone abnormalities.  IMPRESSION: Normal chest   Electronically Signed   By: Paulina FusiMark  Shogry M.D.   On: 03/16/2016 17:14     ____________________________________________   PROCEDURES  Procedure(s) performed:   Procedures  Critical Care performed:   ____________________________________________   INITIAL  IMPRESSION / ASSESSMENT AND PLAN / ED COURSE  Pertinent labs & imaging results that were available during my care of the patient were reviewed by me and considered in my medical decision making (see chart for details).  Patient with very reassuring lab workup. Symptoms ongoing for weeks to years with a normal EKG and normal troponin also normal electrolytes and a reassuring chest x-ray. I recommended that she follow up with cardiology. She is understanding of these plans willing to comply. Possible stress related symptoms as well but should be further worked up for other emergent causes.  Clinical Course     ____________________________________________   FINAL CLINICAL IMPRESSION(S) / ED DIAGNOSES  Palpitations.    NEW MEDICATIONS STARTED DURING THIS VISIT:  New Prescriptions   No medications on file     Note:  This document was prepared using Dragon voice recognition software and may include unintentional dictation errors.    Myrna Blazeravid Matthew Janes Colegrove, MD 03/16/16 [phone]

## 2016-03-16 NOTE — ED Triage Notes (Signed)
Pt reports intermittent heart palpitations and dizziness x3 days. Pt alert and oriented in triage, reports hx of palpitations with stress but said these episodes have continued.

## 2016-03-16 NOTE — ED Notes (Signed)
Discharge instructions reviewed with patient. Patient verbalized understanding. Patient ambulated to lobby without difficulty.   

## 2016-06-06 ENCOUNTER — Ambulatory Visit: Payer: Self-pay

## 2016-06-06 ENCOUNTER — Ambulatory Visit (INDEPENDENT_AMBULATORY_CARE_PROVIDER_SITE_OTHER): Admitting: General Practice

## 2016-06-06 DIAGNOSIS — O3680X0 Pregnancy with inconclusive fetal viability, not applicable or unspecified: Secondary | ICD-10-CM

## 2016-06-06 DIAGNOSIS — Z3201 Encounter for pregnancy test, result positive: Secondary | ICD-10-CM

## 2016-06-06 DIAGNOSIS — Z3689 Encounter for other specified antenatal screening: Secondary | ICD-10-CM

## 2016-06-06 LAB — POCT PREGNANCY, URINE: Preg Test, Ur: POSITIVE — AB

## 2016-06-06 NOTE — Addendum Note (Signed)
Addended by: Jill SideAY, DIANE L on: 06/06/2016 10:11 AM   Modules accepted: Orders

## 2016-06-06 NOTE — Progress Notes (Signed)
Patient here for pregnancy test today. UPT +. LMP 03/25/16, some what sure. EDD 12/31/15 424w3d. Patient reports only taking folic acid and a prenatal vitamin. Patient states she is concerned because she thought she couldn't get pregnant. Patient states she has a history of late first trimester early second trimester loses. Patient states she hemorrhages with these and the last one her heart stopped. Spoke with Dr Adrian BlackwaterStinson who states can get nuchal translucency & nips testing set up for patient & patient needs next available new OB appt. Informed patient & she reports concern over whether or not her baby is alive right now and if not then that's a major problem. Diane will scan patient this morning. Informed patient she will have to wait for that scan & to go ahead and make new OB appt. Patient verbalized understanding & had no questions

## 2016-06-06 NOTE — Progress Notes (Addendum)
Pt informed that the ultrasound is considered a limited OB ultrasound and is not intended to be a complete ultrasound exam.  Patient also informed that the ultrasound is not being completed with the intent of assessing for fetal or placental anomalies or any pelvic abnormalities.  Explained that the purpose of today's ultrasound is to assess for viability and approximate EGA.  Patient acknowledges the purpose of the exam and the limitations of the study.    US results:  Single IUP                     FHR = 175 per M-mode                     CRL =  3.15 cm (10w 0d)

## 2016-06-13 ENCOUNTER — Encounter (HOSPITAL_COMMUNITY): Payer: Self-pay | Admitting: Family Medicine

## 2016-06-15 ENCOUNTER — Encounter: Payer: Self-pay | Admitting: Family Medicine

## 2016-06-15 ENCOUNTER — Ambulatory Visit (INDEPENDENT_AMBULATORY_CARE_PROVIDER_SITE_OTHER): Admitting: Family Medicine

## 2016-06-15 VITALS — BP 115/76 | HR 81 | Wt 204.0 lb

## 2016-06-15 DIAGNOSIS — O021 Missed abortion: Secondary | ICD-10-CM

## 2016-06-15 DIAGNOSIS — Z3490 Encounter for supervision of normal pregnancy, unspecified, unspecified trimester: Secondary | ICD-10-CM | POA: Insufficient documentation

## 2016-06-15 DIAGNOSIS — Z3689 Encounter for other specified antenatal screening: Secondary | ICD-10-CM | POA: Diagnosis not present

## 2016-06-15 DIAGNOSIS — O09529 Supervision of elderly multigravida, unspecified trimester: Secondary | ICD-10-CM

## 2016-06-15 LAB — TSH: TSH: 0.15 m[IU]/L — AB

## 2016-06-15 NOTE — Progress Notes (Signed)
Pt states she is confused. 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   Pt c/o hemorrhaging after all miscarriages even with ASA.

## 2016-06-15 NOTE — Progress Notes (Signed)
CLINICAL DATA:  Pregnant patient in for initial prenatal visit and viability bedside US  EXAM: AB OB ULTRASOUND   TECHNIQUE: AB ultrasound was performed for complete evaluation of the gestation    FINDINGS: Intrauterine gestational sac: Present Embryo: Present Cardiac Activity: Not Present  Heart Rate: 0 bpm.  CRL:2018w0d   By: Janit BernMandy Hutchinson, CMA

## 2016-06-15 NOTE — Progress Notes (Signed)
Patient ID: Lindsey LippsKatalin Gillespie, female   DOB: Apr 23, 1971, 45 y.o.   MRN: [id number] Lindsey LippsKatalin Gillespie is a 45 y.o. [id number] at 6869w5d presenting for new OB visit and found to have no fetal heart tones on intake. Patient has a history of miscarriages at 3410, 8712, and 14 weeks. 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   Confirmed bedside US performed by Janit BernMandy Hutchinson MA with vaginal probe. Confirmed lack of FHT and fetal movement. Confirmed fetal loss. Pt has a history of hemorrhage with past miscarriages. Currently has no spotting/bleeding/cramping.   Assessment and Plan:  #Miscarriage  Message sent to staff to schedule for D&E ASAP Offered support to patient and family Reviewed process for DE Will get Type&Screen and baseline hgb today Labs for recurrent miscarriage w/u (TSH, HA1C, Lupus) Recommend pathology/chromosome analysis on POC which should be discussed with the patient in pre-op Patient would like to know the sex of the fetus   >50% of this 30 minute visit was spent evaluating patient, discussing options, coordinating care and providing support to the patient and family.   Federico FlakeKimberly Niles Demonta Wombles, MD, MPH, ABFM Attending Physician Faculty Practice- [facility]

## 2016-06-16 ENCOUNTER — Encounter: Payer: Self-pay | Admitting: Emergency Medicine

## 2016-06-16 ENCOUNTER — Encounter: Payer: Self-pay | Admitting: *Deleted

## 2016-06-16 ENCOUNTER — Emergency Department
Admission: EM | Admit: 2016-06-16 | Discharge: 2016-06-16 | Disposition: A | Discharge status: Home / Self Care | Attending: Emergency Medicine | Admitting: Emergency Medicine

## 2016-06-16 ENCOUNTER — Emergency Department

## 2016-06-16 DIAGNOSIS — O039 Complete or unspecified spontaneous abortion without complication: Secondary | ICD-10-CM

## 2016-06-16 DIAGNOSIS — R109 Unspecified abdominal pain: Secondary | ICD-10-CM | POA: Diagnosis present

## 2016-06-16 DIAGNOSIS — R102 Pelvic and perineal pain: Secondary | ICD-10-CM | POA: Diagnosis not present

## 2016-06-16 LAB — HCG, QUANTITATIVE, PREGNANCY: HCG, BETA CHAIN, QUANT, S: 165927 m[IU]/mL — AB (ref <5)

## 2016-06-16 LAB — URINALYSIS, COMPLETE (UACMP) WITH MICROSCOPIC
Bilirubin Urine: NEGATIVE
GLUCOSE, UA: NEGATIVE mg/dL
Ketones, ur: NEGATIVE mg/dL
NITRITE: NEGATIVE
PH: 7.5 (ref 5.0–8.0)
Protein, ur: NEGATIVE mg/dL
SPECIFIC GRAVITY, URINE: 1.015 (ref 1.005–1.030)

## 2016-06-16 LAB — TYPE AND SCREEN
ABO/RH(D): B POS
Antibody Screen: NEGATIVE

## 2016-06-16 LAB — HEMOGLOBIN A1C
HEMOGLOBIN A1C: 5.1 % (ref <5.7)
Mean Plasma Glucose: 100 mg/dL

## 2016-06-16 MED ORDER — OXYCODONE-ACETAMINOPHEN 5-325 MG PO TABS: 2.0000 | ORAL_TABLET | Freq: Four times a day (QID) | ORAL | 0 refills | Status: DC | PRN

## 2016-06-16 MED ORDER — ACETAMINOPHEN 500 MG PO TABS
1000.0000 mg | ORAL_TABLET | Freq: Once | ORAL | Status: AC
  Administered 2016-06-16: 1000 mg via ORAL
  Filled 2016-06-16: qty 2

## 2016-06-16 NOTE — ED Notes (Signed)
Pt c/o HA , MD notified and orders initiated.

## 2016-06-16 NOTE — ED Provider Notes (Signed)
[facility] Emergency Department Provider Note        Time seen: ----------------------------------------- 11:06 AM on 06/16/2016 -----------------------------------------    I have reviewed the triage vital signs and the nursing notes.   HISTORY  Chief Complaint Abdominal Pain    HPI Lindsey Gillespie is a 45 y.o. female presents to ER with cramping. Patient was told by an OB/GYN doctor at appointment yesterday that she had possibly miscarried at 11 weeks. She is G7 P3 AB 3. Patient states previous when she had a miscarriage she had a history of severe hemorrhage. She states she started cramping this morning was told that they would be unable to schedule her for a D&C for today.Patient has had pelvic cramping but denies vaginal bleeding at this time.   History reviewed. No pertinent past medical history.  Patient Active Problem List   Diagnosis Date Noted  . Encounter for supervision of normal pregnancy, antepartum 06/15/2016  . AMA (advanced maternal age) multigravida 35+ 06/15/2016  . Clinical depression 10/02/2015  . Calculus of kidney 04/08/2015  . Acid reflux 05/17/2013  . Awareness of heartbeats 05/17/2013  . Currently pregnant 05/17/2013  . Abortion, spontaneous 11/01/2012    Past Surgical History:  Procedure Laterality Date  . CESAREAN SECTION    . DILATION AND CURETTAGE OF UTERUS  2013  . miscarriage     MULTIPLE    Allergies Metronidazole and Tape  Social History Social History  Substance Use Topics  . Smoking status: Never Smoker  . Smokeless tobacco: Never Used  . Alcohol use No    Review of Systems Constitutional: Negative for fever. Cardiovascular: Negative for chest pain. Respiratory: Negative for shortness of breath. Gastrointestinal: Positive for pelvic cramping Genitourinary: Negative for dysuria. Musculoskeletal: Negative for back pain. Skin: Negative for rash. Neurological: Negative for headaches, focal weakness  or numbness.  10-point ROS otherwise negative.  ____________________________________________   PHYSICAL EXAM:  VITAL SIGNS: ED Triage Vitals  Enc Vitals Group     BP 06/16/16 1035 127/76     Pulse Rate 06/16/16 1035 86     Resp 06/16/16 1035 18     Temp 06/16/16 1035 98.2 F (36.8 C)     Temp Source 06/16/16 1035 Oral     SpO2 06/16/16 1035 99 %     Weight 06/16/16 1035 200 lb (90.7 kg)     Height 06/16/16 1035 5\' 4"  (1.626 m)     Head Circumference --      Peak Flow --      Pain Score 06/16/16 1036 6     Pain Loc --      Pain Edu? --      Excl. in GC? --     Constitutional: Alert and oriented. Well appearing and in no distress. Eyes: Conjunctivae are normal. PERRL. Normal extraocular movements. ENT   Head: Normocephalic and atraumatic.   Nose: No congestion/rhinnorhea.   Mouth/Throat: Mucous membranes are moist.   Neck: No stridor. Cardiovascular: Normal rate, regular rhythm. No murmurs, rubs, or gallops. Respiratory: Normal respiratory effort without tachypnea nor retractions. Breath sounds are clear and equal bilaterally. No wheezes/rales/rhonchi. Gastrointestinal: Soft and nontender. Normal bowel sounds Musculoskeletal: Nontender with normal range of motion in all extremities. No lower extremity tenderness nor edema. Neurologic:  Normal speech and language. No gross focal neurologic deficits are appreciated.  Skin:  Skin is warm, dry and intact. No rash noted. Psychiatric: Mood and affect are normal. Speech and behavior are normal.  ____________________________________________  ED COURSE:  Pertinent labs & imaging results that were available during my care of the patient were reviewed by me and considered in my medical decision making (see chart for details). Clinical Course   Patient presents in no distress, we will reassess with ultrasound and discussed with OB.  Procedures ____________________________________________   LABS (pertinent  positives/negatives)  Labs Reviewed  HCG, QUANTITATIVE, PREGNANCY - Abnormal; Notable for the following:       Result Value   hCG, Beta Chain, Quant, S [id number] (*)    All other components within normal limits  URINALYSIS, COMPLETE (UACMP) WITH MICROSCOPIC - Abnormal; Notable for the following:    Hgb urine dipstick SMALL (*)    Leukocytes, UA SMALL (*)    Squamous Epithelial / LPF 6-30 (*)    Bacteria, UA FEW (*)    All other components within normal limits  CBC WITH DIFFERENTIAL/PLATELET  COMPREHENSIVE METABOLIC PANEL  TYPE AND SCREEN    RADIOLOGY Images were viewed by me  Pelvic ultrasound IMPRESSION: Crown-rump length of 49.9 mm with no cardiac activity detectable. Findings meet definitive criteria for failed pregnancy. This follows SRU consensus guidelines: Diagnostic Criteria for Nonviable Pregnancy Early in the First Trimester. Macy MisN Engl J Med [phone];369:1443-51. ____________________________________________  FINAL ASSESSMENT AND PLAN  Miscarriage  Plan: Patient with labs and imaging as dictated above. Patient had presented with fetal demise. We have discussed with OB/GYN on call, who states they will arrange for outpatient D&C. Patient is stable and not bleeding at this time. There is nothing further to offer at this point.   Emily FilbertWilliams, Tomothy Eddins E, MD   Note: This dictation was prepared with Dragon dictation. Any transcriptional errors that result from this process are unintentional    Emily FilbertJonathan E Alessander Sikorski, MD 06/16/16 [phone]

## 2016-06-16 NOTE — ED Triage Notes (Signed)
Pt presents to ED with c/o cramping. Pt states was told by OBGYN at her appt yesterday that she had miscarried at 11 weeks. Pt is a G7A4L3. Pt states previously when she has miscarried she has a hx of severe hemorrhage. Pt is alert and oriented, states she started cramping this morning and was told that they would be unable to schedule her D&C for today.

## 2016-06-16 NOTE — ED Notes (Signed)
MD and patent relations at bedside for d/c instructions and further conversation on patient concerns

## 2016-06-16 NOTE — Progress Notes (Signed)
ED Attending called office to inquire about pt's upcoming D&C. Advised MD that we would call patient to notify her of her set time for procedure. He will let pt to know to expect our call.

## 2016-06-17 LAB — RFX PTT-LA W/RFX TO HEX PHASE CONF: PTT-LA Screen: 38 s (ref <=40)

## 2016-06-17 LAB — LUPUS ANTICOAGULANT PANEL

## 2016-06-17 LAB — RFX DRVVT SCR W/RFLX CONF 1:1 MIX: DRVVT SCREEN: 38 s (ref <=45)

## 2016-06-18 ENCOUNTER — Ambulatory Visit (HOSPITAL_COMMUNITY)
Admission: AD | Admit: 2016-06-18 | Discharge: 2016-06-18 | Disposition: A | Discharge status: Home / Self Care | Source: Ambulatory Visit | Attending: Obstetrics & Gynecology | Admitting: Obstetrics & Gynecology

## 2016-06-18 ENCOUNTER — Encounter (HOSPITAL_COMMUNITY): Payer: Self-pay | Admitting: *Deleted

## 2016-06-18 ENCOUNTER — Ambulatory Visit (HOSPITAL_COMMUNITY): Admission: RE | Admit: 2016-06-18 | Source: Ambulatory Visit | Admitting: Obstetrics & Gynecology

## 2016-06-18 ENCOUNTER — Encounter (HOSPITAL_COMMUNITY)
Admission: AD | Disposition: A | Discharge status: Home / Self Care | Payer: Self-pay | Source: Ambulatory Visit | Attending: Obstetrics & Gynecology

## 2016-06-18 ENCOUNTER — Inpatient Hospital Stay (HOSPITAL_COMMUNITY): Admitting: Anesthesiology

## 2016-06-18 DIAGNOSIS — O021 Missed abortion: Secondary | ICD-10-CM

## 2016-06-18 DIAGNOSIS — F329 Major depressive disorder, single episode, unspecified: Secondary | ICD-10-CM | POA: Insufficient documentation

## 2016-06-18 DIAGNOSIS — K219 Gastro-esophageal reflux disease without esophagitis: Secondary | ICD-10-CM | POA: Insufficient documentation

## 2016-06-18 DIAGNOSIS — Z6834 Body mass index (BMI) 34.0-34.9, adult: Secondary | ICD-10-CM | POA: Diagnosis not present

## 2016-06-18 HISTORY — PX: DILATION AND EVACUATION: SHX1459

## 2016-06-18 LAB — CBC
HEMATOCRIT: 35.5 % — AB (ref 36.0–46.0)
HEMOGLOBIN: 12.7 g/dL (ref 12.0–15.0)
MCH: 30.1 pg (ref 26.0–34.0)
MCHC: 35.8 g/dL (ref 30.0–36.0)
MCV: 84.1 fL (ref 78.0–100.0)
Platelets: 289 10*3/uL (ref 150–400)
RBC: 4.22 MIL/uL (ref 3.87–5.11)
RDW: 13.6 % (ref 11.5–15.5)
WBC: 10.3 10*3/uL (ref 4.0–10.5)

## 2016-06-18 LAB — TYPE AND SCREEN
ABO/RH(D): B POS
ANTIBODY SCREEN: NEGATIVE

## 2016-06-18 LAB — ABO/RH: ABO/RH(D): B POS

## 2016-06-18 SURGERY — DILATION AND EVACUATION, UTERUS
Anesthesia: General | Site: Vagina

## 2016-06-18 MED ORDER — PROPOFOL 10 MG/ML IV BOLUS
INTRAVENOUS | Status: DC | PRN
  Administered 2016-06-18: 200 mg via INTRAVENOUS

## 2016-06-18 MED ORDER — LACTATED RINGERS IV SOLN
INTRAVENOUS | Status: DC
  Administered 2016-06-18 (×2): via INTRAVENOUS

## 2016-06-18 MED ORDER — LACTATED RINGERS IV BOLUS (SEPSIS)
1000.0000 mL | Freq: Once | INTRAVENOUS | Status: AC
  Administered 2016-06-18: 1000 mL via INTRAVENOUS

## 2016-06-18 MED ORDER — LIDOCAINE HCL (CARDIAC) 20 MG/ML IV SOLN
INTRAVENOUS | Status: DC | PRN
  Administered 2016-06-18: 100 mg via INTRAVENOUS

## 2016-06-18 MED ORDER — DEXAMETHASONE SODIUM PHOSPHATE 10 MG/ML IJ SOLN
INTRAMUSCULAR | Status: DC | PRN
  Administered 2016-06-18: 4 mg via INTRAVENOUS

## 2016-06-18 MED ORDER — HYDROMORPHONE HCL 1 MG/ML IJ SOLN: 0.2500 mg | INTRAMUSCULAR | Status: DC | PRN

## 2016-06-18 MED ORDER — LACTATED RINGERS IV SOLN
INTRAVENOUS | Status: DC
  Administered 2016-06-18: 125 mL/h via INTRAVENOUS

## 2016-06-18 MED ORDER — BUPIVACAINE-EPINEPHRINE 0.5% -1:200000 IJ SOLN
INTRAMUSCULAR | Status: DC | PRN
  Administered 2016-06-18: 20 mL

## 2016-06-18 MED ORDER — FAMOTIDINE IN NACL 20-0.9 MG/50ML-% IV SOLN
20.0000 mg | Freq: Once | INTRAVENOUS | Status: AC
  Administered 2016-06-18: 20 mg via INTRAVENOUS
  Filled 2016-06-18: qty 50

## 2016-06-18 MED ORDER — FENTANYL CITRATE (PF) 100 MCG/2ML IJ SOLN
INTRAMUSCULAR | Status: DC | PRN
  Administered 2016-06-18 (×2): 25 ug via INTRAVENOUS

## 2016-06-18 MED ORDER — 0.9 % SODIUM CHLORIDE (POUR BTL) OPTIME
TOPICAL | Status: DC | PRN
  Administered 2016-06-18: 1000 mL

## 2016-06-18 MED ORDER — OXYTOCIN 10 UNIT/ML IJ SOLN
INTRAMUSCULAR | Status: DC | PRN
  Administered 2016-06-18: 10 [IU]

## 2016-06-18 MED ORDER — ONDANSETRON HCL 4 MG/2ML IJ SOLN
INTRAMUSCULAR | Status: DC | PRN
  Administered 2016-06-18: 4 mg via INTRAVENOUS

## 2016-06-18 MED ORDER — PROMETHAZINE HCL 25 MG/ML IJ SOLN: 6.2500 mg | INTRAMUSCULAR | Status: DC | PRN

## 2016-06-18 MED ORDER — SOD CITRATE-CITRIC ACID 500-334 MG/5ML PO SOLN
30.0000 mL | Freq: Once | ORAL | Status: AC
  Administered 2016-06-18: 30 mL via ORAL
  Filled 2016-06-18: qty 15

## 2016-06-18 MED ORDER — DOXYCYCLINE HYCLATE 100 MG IV SOLR
100.0000 mg | Freq: Two times a day (BID) | INTRAVENOUS | Status: DC
  Administered 2016-06-18: 100 mg via INTRAVENOUS
  Filled 2016-06-18: qty 100

## 2016-06-18 MED ORDER — DOXYCYCLINE HYCLATE 100 MG IV SOLR: 200.0000 mg | INTRAVENOUS | Status: DC

## 2016-06-18 MED ORDER — MIDAZOLAM HCL 2 MG/2ML IJ SOLN
INTRAMUSCULAR | Status: DC | PRN
  Administered 2016-06-18: 2 mg via INTRAVENOUS

## 2016-06-18 MED ORDER — KETOROLAC TROMETHAMINE 30 MG/ML IJ SOLN
INTRAMUSCULAR | Status: DC | PRN
  Administered 2016-06-18: 30 mg via INTRAVENOUS

## 2016-06-18 SURGICAL SUPPLY — 20 items
CATH ROBINSON RED A/P 16FR (CATHETERS) ×3 IMPLANT
CLOTH BEACON ORANGE TIMEOUT ST (SAFETY) ×3 IMPLANT
DECANTER SPIKE VIAL GLASS SM (MISCELLANEOUS) ×3 IMPLANT
GLOVE BIO SURGEON STRL SZ 6.5 (GLOVE) ×2 IMPLANT
GLOVE BIO SURGEONS STRL SZ 6.5 (GLOVE) ×1
GLOVE BIOGEL PI IND STRL 7.0 (GLOVE) ×2 IMPLANT
GLOVE BIOGEL PI INDICATOR 7.0 (GLOVE) ×4
GOWN STRL REUS W/TWL LRG LVL3 (GOWN DISPOSABLE) ×6 IMPLANT
KIT BERKELEY 1ST TRIMESTER 3/8 (MISCELLANEOUS) IMPLANT
NS IRRIG 1000ML POUR BTL (IV SOLUTION) ×3 IMPLANT
PACK VAGINAL MINOR WOMEN LF (CUSTOM PROCEDURE TRAY) ×3 IMPLANT
PAD OB MATERNITY 4.3X12.25 (PERSONAL CARE ITEMS) ×3 IMPLANT
PAD PREP 24X48 CUFFED NSTRL (MISCELLANEOUS) ×3 IMPLANT
SET BERKELEY SUCTION TUBING (SUCTIONS) IMPLANT
TOWEL OR 17X24 6PK STRL BLUE (TOWEL DISPOSABLE) ×6 IMPLANT
VACURETTE 10 RIGID CVD (CANNULA) IMPLANT
VACURETTE 12 RIGID CVD (CANNULA) ×3 IMPLANT
VACURETTE 7MM CVD STRL WRAP (CANNULA) IMPLANT
VACURETTE 8 RIGID CVD (CANNULA) IMPLANT
VACURETTE 9 RIGID CVD (CANNULA) IMPLANT

## 2016-06-18 NOTE — Anesthesia Preprocedure Evaluation (Addendum)
Anesthesia Evaluation  Patient identified by MRN, date of birth, ID band Patient awake    Reviewed: Allergy & Precautions, NPO status , Patient's Chart, lab work & pertinent test results  History of Anesthesia Complications Negative for: history of anesthetic complications  Airway Mallampati: II  TM Distance: >3 FB Neck ROM: Full    Dental no notable dental hx. (+) Dental Advisory Given   Pulmonary neg pulmonary ROS,    Pulmonary exam normal        Cardiovascular negative cardio ROS Normal cardiovascular exam     Neuro/Psych PSYCHIATRIC DISORDERS Depression negative neurological ROS     GI/Hepatic Neg liver ROS, GERD  ,  Endo/Other  Morbid obesity  Renal/GU negative Renal ROS     Musculoskeletal negative musculoskeletal ROS (+)   Abdominal   Peds  Hematology negative hematology ROS (+)   Anesthesia Other Findings Day of surgery medications reviewed with the patient.  Reproductive/Obstetrics                            Anesthesia Physical Anesthesia Plan  ASA: III  Anesthesia Plan: General   Post-op Pain Management:    Induction: Intravenous  Airway Management Planned: LMA and Oral ETT  Additional Equipment:   Intra-op Plan:   Post-operative Plan: Extubation in OR  Informed Consent: I have reviewed the patients History and Physical, chart, labs and discussed the procedure including the risks, benefits and alternatives for the proposed anesthesia with the patient or authorized representative who has indicated his/her understanding and acceptance.   Dental advisory given  Plan Discussed with: CRNA, Anesthesiologist and Surgeon  Anesthesia Plan Comments:        Anesthesia Quick Evaluation

## 2016-06-18 NOTE — MAU Note (Signed)
Here for scheduled D&E, pt had missed AB.

## 2016-06-18 NOTE — Anesthesia Postprocedure Evaluation (Signed)
Anesthesia Post Note  Patient: Lindsey Gillespie  Procedure(s) Performed: Procedure(s) (LRB): DILATATION AND EVACUATION (N/A)  Patient location during evaluation: PACU Anesthesia Type: General Level of consciousness: sedated Pain management: pain level controlled Vital Signs Assessment: post-procedure vital signs reviewed and stable Respiratory status: spontaneous breathing and respiratory function stable Cardiovascular status: stable Anesthetic complications: no     Last Vitals:  Vitals:   06/18/16 1215 06/18/16 1300  BP: 109/67 98/61  Pulse: (!) 102 82  Resp: 14 16  Temp: 36.9 C 36.6 C    Last Pain:  Vitals:   06/18/16 1300  TempSrc:   PainSc: 1    Pain Goal:                 Riccardo Holeman DANIEL

## 2016-06-18 NOTE — H&P (Signed)
Lindsey Gillespie is an 45 y.o. female. [id number] 4521w1d [facility] diagnosed 12/13 by US  Pertinent Gynecological History:    Blood transfusions: none Sexually transmitted diseases: no past history Previous GYN Procedures: D&C Last pap: normal Date:      Patient's last menstrual period was 03/25/2016.    History reviewed. No pertinent past medical history.  Past Surgical History:  Procedure Laterality Date  . CESAREAN SECTION    . DILATION AND CURETTAGE OF UTERUS  2013  . miscarriage     MULTIPLE    Family History  Problem Relation Age of Onset  . Hypertension Mother   . Cancer Father   . Hematuria Neg Hx     Social History:  reports that she has never smoked. She has never used smokeless tobacco. She reports that she does not drink alcohol or use drugs.  Allergies:  Allergies  Allergen Reactions  . Metronidazole Swelling    Other reaction(s): SWELLING/EDEMA  . Tape Other (See Comments)    Other reaction(s): HIVES    Prescriptions Prior to Admission  Medication Sig Dispense Refill Last Dose  . oxyCODONE-acetaminophen (PERCOCET) 5-325 MG tablet Take 2 tablets by mouth every 6 (six) hours as needed for moderate pain or severe pain. (Patient not taking: Reported on 06/17/2016) 20 tablet 0 Not Taking at Unknown time    Review of Systems  Constitutional: Negative.   Respiratory: Negative.   Cardiovascular: Negative.   Genitourinary: Negative.     Blood pressure 115/59, pulse 83, temperature 97.8 F (36.6 C), temperature source Oral, resp. rate 18, height 5\' 4"  (1.626 m), weight 92 kg (202 lb 14.4 oz), last menstrual period 03/25/2016, SpO2 99 %. Physical Exam  Vitals reviewed. Constitutional: She appears well-developed and well-nourished.  Cardiovascular: Normal rate.   Respiratory: Effort normal. No respiratory distress.  GI: Soft. She exhibits no distension.  Musculoskeletal: Normal range of motion.  Skin: Skin is warm and  dry.  Psychiatric: She has a normal mood and affect. Her behavior is normal.    No results found for this or any previous visit (from the past 24 hour(s)).  [place] Ob Comp Less 14 Wks  Result Date: 06/16/2016 CLINICAL DATA:  45 year old female with pelvic pain and cramping for 1 day, possible fetal demise. No fetal heart tones on exam yesterday. Quantitative beta HCG [id number]. EXAM: OBSTETRIC <14 WK ULTRASOUND TECHNIQUE: Transabdominal ultrasound was performed for evaluation of the gestation as well as the maternal uterus and adnexal regions. COMPARISON:  None. FINDINGS: Intrauterine gestational sac: Single Yolk sac:  Not visible Embryo:  Visible Cardiac Activity: None detected (image 11) Heart Rate: Not applicable bpm CRL:   49.9  mm   11 w 5 d Subchorionic hemorrhage:  None visualized. Maternal uterus/adnexae: No pelvic free fluid. Neither ovary is identified. IMPRESSION: Crown-rump length of 49.9 mm with no cardiac activity detectable. Findings meet definitive criteria for failed pregnancy. This follows SRU consensus guidelines: Diagnostic Criteria for Nonviable Pregnancy Early in the First Trimester. Macy Mis Engl J Med [id number]. Electronically Signed   By: Odessa FlemingH  Hall M.D.   On: 06/16/2016 12:02    Assessment/Plan: Missed abortion 2421w1d Suction D&C today. Patient desires surgical management .  The risks of surgery were discussed in detail with the patient including but not limited to: bleeding which may require transfusion or reoperation; infection which may require prolonged hospitalization or re-hospitalization and antibiotic therapy; injury to bowel, bladder, ureters and major vessels or other surrounding organs; need for additional  procedures including laparotomy; thromboembolic phenomenon, incisional problems and other postoperative or anesthesia complications.  Patient was told that the likelihood that her condition and symptoms will be treated effectively with this surgical management was very  high; the postoperative expectations were also discussed in detail. The patient also understands the alternative treatment options which were discussed in full. All questions were answered.   Scheryl DarterJames Twan Harkin 06/18/2016, 9:05 AM

## 2016-06-18 NOTE — Anesthesia Procedure Notes (Signed)
Procedure Name: LMA Insertion Date/Time: 06/18/2016 10:32 AM Performed by: Elgie CongoMALINOVA, Koula Venier H Pre-anesthesia Checklist: Patient identified, Emergency Drugs available, Suction available and Patient being monitored Patient Re-evaluated:Patient Re-evaluated prior to inductionOxygen Delivery Method: Circle system utilized Preoxygenation: Pre-oxygenation with 100% oxygen Intubation Type: IV induction Ventilation: Mask ventilation without difficulty LMA: LMA inserted LMA Size: 4.0 Number of attempts: 1 Placement Confirmation: positive ETCO2 and breath sounds checked- equal and bilateral Tube secured with: Tape Dental Injury: Teeth and Oropharynx as per pre-operative assessment

## 2016-06-18 NOTE — Transfer of Care (Signed)
Immediate Anesthesia Transfer of Care Note  Patient: Lindsey Gillespie  Procedure(s) Performed: Procedure(s): DILATATION AND EVACUATION (N/A)  Patient Location: PACU  Anesthesia Type:General  Level of Consciousness: awake, alert  and oriented  Airway & Oxygen Therapy: Patient Spontanous Breathing and Patient connected to nasal cannula oxygen  Post-op Assessment: Report given to RN, Post -op Vital signs reviewed and stable and Patient moving all extremities  Post vital signs: Reviewed and stable  Last Vitals:  Vitals:   06/18/16 0801  BP: 115/59  Pulse: 83  Resp: 18  Temp: 36.6 C    Last Pain:  Vitals:   06/18/16 0803  TempSrc:   PainSc: 3          Complications: No apparent anesthesia complications

## 2016-06-18 NOTE — Op Note (Signed)
Lindsey Gillespie PROCEDURE DATE: 06/18/2016  PREOPERATIVE DIAGNOSIS: 11wk 5 d week missed abortion POSTOPERATIVE DIAGNOSIS: The same PROCEDURE:     Dilation and Evacuation SURGEON:  Scheryl DarterJames Arnold MD  INDICATIONS: 45 y.o. [phone] with MAB at 11wk 5 day weeks gestation, needing surgical completion.  Risks of surgery were discussed with the patient including but not limited to: bleeding which may require transfusion; infection which may require antibiotics; injury to uterus or surrounding organs; need for additional procedures including laparotomy or laparoscopy; possibility of intrauterine scarring which may impair future fertility; and other postoperative/anesthesia complications. Written informed consent was obtained.    FINDINGS:  A 11 week size uterus, moderate amounts of products of conception, specimen sent to pathology.  ANESTHESIA:    Monitored intravenous sedation, paracervical block. INTRAVENOUS FLUIDS:  100 ml of LR with 10 units pitocin ESTIMATED BLOOD LOSS:  Less than 150 ml. SPECIMENS:  Products of conception sent to pathology, chromosomal analysis sent COMPLICATIONS:  None immediate.  PROCEDURE DETAILS:  The patient received intravenous Doxycycline while in the preoperative area.  She was then taken to the operating room where monitored intravenous sedation was administered and was found to be adequate.  After an adequate timeout was performed, she was placed in the dorsal lithotomy position and examined; then prepped and draped in the sterile manner.   Her bladder was catheterized for an unmeasured amount of clear, yellow urine. A vaginal speculum was then placed in the patient's vagina and a single tooth tenaculum was applied to the anterior lip of the cervix.  A paracervical block using 20 ml of 0.5% Marcaine was administered. The cervix was gently dilated to accommodate a 12 mm suction curette that was gently advanced to the uterine fundus.  The suction device was then activated and  curette slowly rotated to clear the uterus of products of conception.  3 collections baskets where used. A sharp curettage was then performed to confirm complete emptying of the uterus. There was minimal bleeding noted and the tenaculum removed with good hemostasis noted.   All instruments were removed from the patient's vagina.  Sponge and instrument counts were correct times two  The patient tolerated the procedure well and was taken to the recovery area awake, and in stable condition.  Ernestina PennaNicholas Keni Wafer, MD 06/18/2016 1112

## 2016-06-18 NOTE — Discharge Instructions (Signed)
Post Anesthesia Home Care Instructions  Activity: Get plenty of rest for the remainder of the day. A responsible adult should stay with you for 24 hours following the procedure.  For the next 24 hours, DO NOT: -Drive a car -Advertising copywriterperate machinery -Drink alcoholic beverages -Take any medication unless instructed by your physician -Make any legal decisions or sign important papers.  Meals: Start with liquid foods such as gelatin or soup. Progress to regular foods as tolerated. Avoid greasy, spicy, heavy foods. If nausea and/or vomiting occur, drink only clear liquids until the nausea and/or vomiting subsides. Call your physician if vomiting continues.  Special Instructions/Symptoms: Your throat may feel dry or sore from the anesthesia or the breathing tube placed in your throat during surgery. If this causes discomfort, gargle with warm salt water. The discomfort should disappear within 24 hours.  If you had a scopolamine patch placed behind your ear for the management of post- operative nausea and/or vomiting:  1. The medication in the patch is effective for 72 hours, after which it should be removed.  Wrap patch in a tissue and discard in the trash. Wash hands thoroughly with soap and water. 2. You may remove the patch earlier than 72 hours if you experience unpleasant side effects which may include dry mouth, dizziness or visual disturbances. 3. Avoid touching the patch. Wash your hands with soap and water after contact with the patch.   Dilation and Curettage or Vacuum Curettage, Care After These instructions give you information about caring for yourself after your procedure. Your doctor may also give you more specific instructions. Call your doctor if you have any problems or questions after your procedure. Follow these instructions at home: Activity  Do not drive or use heavy machinery while taking prescription pain medicine.  For 24 hours after your procedure, avoid driving.  Take  short walks often, followed by rest periods. Ask your doctor what activities are safe for you. After one or two days, you may be able to return to your normal activities.  Do not lift anything that is heavier than 10 lb (4.5 kg) until your doctor approves.  For at least 2 weeks, or as long as told by your doctor:  Do not douche.  Do not use tampons.  Do not have sex. General instructions  Take over-the-counter and prescription medicines only as told by your doctor. This is very important if you take blood thinning medicine.  Do not take baths, swim, or use a hot tub until your doctor approves. Take showers instead of baths.  Wear compression stockings as told by your doctor.  It is up to you to get the results of your procedure. Ask your doctor when your results will be ready.  Keep all follow-up visits as told by your doctor. This is important. Contact a doctor if:  You have very bad cramps that get worse or do not get better with medicine.  You have very bad pain in your belly (abdomen).  You cannot drink fluids without throwing up (vomiting).  You get pain in a different part of the area between your belly and thighs (pelvis).  You have bad-smelling discharge from your vagina.  You have a rash. Get help right away if:  You are bleeding a lot from your vagina. A lot of bleeding means soaking more than one sanitary pad in an hour, for 2 hours in a row.  You have clumps of blood (blood clots) coming from your vagina.  You have a  fever or chills.  Your belly feels very tender or hard.  You have chest pain.  You have trouble breathing.  You cough up blood.  You feel dizzy.  You feel light-headed.  You pass out (faint).  You have pain in your neck or shoulder area. Summary  Take short walks often, followed by rest periods. Ask your doctor what activities are safe for you. After one or two days, you may be able to return to your normal activities.  Do not  lift anything that is heavier than 10 lb (4.5 kg) until your doctor approves.  Do not take baths, swim, or use a hot tub until your doctor approves. Take showers instead of baths.  Contact your doctor if you have any symptoms of infection, like bad-smelling discharge from your vagina. This information is not intended to replace advice given to you by your health care provider. Make sure you discuss any questions you have with your health care provider. Document Released: 03/29/2008 Document Revised: 03/07/2016 Document Reviewed: 03/07/2016 Elsevier Interactive Patient Education  2017 [employer]

## 2016-06-19 ENCOUNTER — Encounter (HOSPITAL_COMMUNITY): Payer: Self-pay | Admitting: Obstetrics & Gynecology

## 2016-06-20 ENCOUNTER — Encounter: Payer: Self-pay | Admitting: Family Medicine

## 2016-06-20 DIAGNOSIS — N96 Recurrent pregnancy loss: Secondary | ICD-10-CM | POA: Insufficient documentation

## 2016-06-24 ENCOUNTER — Ambulatory Visit (HOSPITAL_COMMUNITY): Payer: Self-pay

## 2016-06-24 ENCOUNTER — Encounter (HOSPITAL_COMMUNITY): Payer: Self-pay

## 2016-07-13 ENCOUNTER — Encounter: Payer: Self-pay | Admitting: Family Medicine

## 2016-07-13 ENCOUNTER — Ambulatory Visit: Admitting: Family Medicine

## 2016-07-13 VITALS — BP 130/81 | HR 99 | Ht 64.0 in | Wt 204.0 lb

## 2016-07-13 DIAGNOSIS — O039 Complete or unspecified spontaneous abortion without complication: Secondary | ICD-10-CM

## 2016-07-13 NOTE — Progress Notes (Signed)
Patient ID: Lindsey Gillespie, female   DOB: 1970-07-06, 46 y.o.   MRN: [id number]    CLINIC ENCOUNTER NOTE  History:  46 y.o. [id number] here today for follow up DE  D&E on 12/16 - pathology reviewed- [redacted]w[redacted]d fetus. Family desired genetic screening/eval of fetus for sex of the baby especially. Expressed frustration that "no one gave her progesterone to keep this pregnancy" and that the providers in this area "seem to only care about the money" and would "not see me before it was too late." She expressed that she feels "the only reason I was able to keep one pregnancy was because of progesterone from 6 weeks to 12 weeks"  She denies any abnormal vaginal discharge, bleeding, pelvic pain or other concerns.   History reviewed. No pertinent past medical history.  Past Surgical History:  Procedure Laterality Date  . CESAREAN SECTION    . DILATION AND CURETTAGE OF UTERUS  2013  . DILATION AND EVACUATION N/A 06/18/2016   Procedure: DILATATION AND EVACUATION;  Surgeon: Adam Phenix, MD;  Location: [facility];  Service: Gynecology;  Laterality: N/A;  . miscarriage     MULTIPLE    The following portions of the patient's history were reviewed and updated as appropriate: allergies, current medications, past family history, past medical history, past social history, past surgical history and problem list.   Review of Systems:  Pertinent items noted in HPI and remainder of comprehensive ROS otherwise negative.   Objective:  Physical Exam BP 130/81   Pulse 99   Ht 5\' 4"  (1.626 m)   Wt 204 lb (92.5 kg)   LMP 03/25/2016   Breastfeeding? Unknown   BMI 35.02 kg/m  CONSTITUTIONAL: Well-developed, well-nourished female in no acute distress.  HENT:  Normocephalic, atraumatic. External right and left ear normal. Oropharynx is clear and moist EYES: Conjunctivae and EOM are normal. Pupils are equal, round, and reactive to light. No scleral icterus.  NECK: Normal range of motion, supple, no masses SKIN: Skin  is warm and dry. No rash noted. Not diaphoretic. No erythema. No pallor. NEUROLGIC: Alert and oriented to person, place, and time. Normal reflexes, muscle tone coordination. No cranial nerve deficit noted. PSYCHIATRIC: Normal mood and affect. Normal behavior. Normal judgment and thought content. CARDIOVASCULAR: Normal heart rate noted RESPIRATORY: Effort and breath sounds normal, no problems with respiration noted ABDOMEN: Soft, no distention noted.   PELVIC: Deferred MUSCULOSKELETAL: Normal range of motion. No edema noted.  Labs and Imaging US Ob Comp Less 14 Wks  Result Date: 06/16/2016 CLINICAL DATA:  46 year old female with pelvic pain and cramping for 1 day, possible fetal demise. No fetal heart tones on exam yesterday. Quantitative beta HCG [id number]. EXAM: OBSTETRIC <14 WK ULTRASOUND TECHNIQUE: Transabdominal ultrasound was performed for evaluation of the gestation as well as the maternal uterus and adnexal regions. COMPARISON:  None. FINDINGS: Intrauterine gestational sac: Single Yolk sac:  Not visible Embryo:  Visible Cardiac Activity: None detected (image 11) Heart Rate: Not applicable bpm CRL:   49.9  mm   11 w 5 d Subchorionic hemorrhage:  None visualized. Maternal uterus/adnexae: No pelvic free fluid. Neither ovary is identified. IMPRESSION: Crown-rump length of 49.9 mm with no cardiac activity detectable. Findings meet definitive criteria for failed pregnancy. This follows SRU consensus guidelines: Diagnostic Criteria for Nonviable Pregnancy Early in the First Trimester. Macy Mis J Med [phone]. Electronically Signed   By: Odessa Fleming M.D.   On: 06/16/2016 12:02    Assessment & Plan:   Miscarriage -  completed DE on 12/16 - provided support to patient - Reviewed safe to try for another pregnancy after 1 menstrual cycle - Patient desired genetic screening on fetus but this does not seem to have been done. I called pathology to see if it can be done - Reviewed with patient results  of TSH testing which showed possible hyperthyroidism-- will get TSH and free T4/T3 today - Discussed establishing care early in pregnancy and increased risk of miscarriage after 46yo  Routine preventative health maintenance measures emphasized. Please refer to After Visit Summary for other counseling recommendations.   Return if symptoms worsen or fail to improve.   Total face-to-face time with patient: 25 minutes. Over 50% of encounter was spent on counseling and coordination of care.

## 2016-07-13 NOTE — Progress Notes (Signed)
Follow up D&C 06/19/16. Pt states she had very light bleeding for 3wks and no bleeding since then. She states she has had cravings like she did with previous pregnancy

## 2016-07-14 ENCOUNTER — Telehealth: Payer: Self-pay | Admitting: *Deleted

## 2016-07-14 LAB — T3, FREE: T3, Free: 3 pg/mL (ref 2.0–4.4)

## 2016-07-14 LAB — T4, FREE: FREE T4: 1.04 ng/dL (ref 0.82–1.77)

## 2016-07-14 LAB — TSH: TSH: 1.29 u[IU]/mL (ref 0.450–4.500)

## 2016-07-14 NOTE — Telephone Encounter (Signed)
Informed pt of normal test results.

## 2016-07-14 NOTE — Telephone Encounter (Signed)
-----   Message from Richrd PrimeMindy T Cain sent at 07/14/2016  4:24 PM EST ----- Regarding: test results Contact: [phone] Patient wants test results. Please call [phone] her normal phone is broke.   Thanks

## 2016-08-02 ENCOUNTER — Encounter: Payer: Self-pay | Admitting: Family Medicine

## 2016-08-02 ENCOUNTER — Ambulatory Visit (INDEPENDENT_AMBULATORY_CARE_PROVIDER_SITE_OTHER): Admitting: Family Medicine

## 2016-08-02 VITALS — BP 118/79 | HR 75 | Ht 64.0 in | Wt 201.0 lb

## 2016-08-02 DIAGNOSIS — N939 Abnormal uterine and vaginal bleeding, unspecified: Secondary | ICD-10-CM | POA: Insufficient documentation

## 2016-08-02 DIAGNOSIS — Z1151 Encounter for screening for human papillomavirus (HPV): Secondary | ICD-10-CM

## 2016-08-02 DIAGNOSIS — Z124 Encounter for screening for malignant neoplasm of cervix: Secondary | ICD-10-CM

## 2016-08-02 NOTE — Assessment & Plan Note (Signed)
Following D and C. Repeat CBC and HCG and check pelvic sono.

## 2016-08-02 NOTE — Progress Notes (Signed)
D&E 06/18/16 states she has been bleeding more days than not since then

## 2016-08-02 NOTE — Progress Notes (Signed)
   Subjective:    Patient ID: Lindsey LippsKatalin Montville is a 46 y.o. female presenting with Follow-up  on 08/02/2016  HPI: S/p D & C for SAB at 12 wks 6 wks ago. Here today for annual exam. She continues to bleed, some days it is heavy and some days it is light. Does not feel pregnant. Thinks she had a normal cycle in January, but continue to have spotting. Needs pap smear today.  Review of Systems  Constitutional: Negative for chills and fever.  Respiratory: Negative for shortness of breath.   Cardiovascular: Negative for chest pain.  Gastrointestinal: Negative for abdominal pain, nausea and vomiting.  Genitourinary: Negative for dysuria.  Skin: Negative for rash.      Objective:    BP 118/79   Pulse 75   Ht 5\' 4"  (1.626 m)   Wt 201 lb (91.2 kg)   BMI 34.50 kg/m  Physical Exam  Constitutional: She is oriented to person, place, and time. She appears well-developed and well-nourished. No distress.  HENT:  Head: Normocephalic and atraumatic.  Eyes: No scleral icterus.  Neck: Neck supple.  Cardiovascular: Normal rate.   Pulmonary/Chest: Effort normal.  Abdominal: Soft.  Neurological: She is alert and oriented to person, place, and time.  Skin: Skin is warm and dry.  Psychiatric: She has a normal mood and affect.        Assessment & Plan:   Problem List Items Addressed This Visit      Unprioritized   Abnormal uterine bleeding    Following D and C. Repeat CBC and HCG and check pelvic sono.      Relevant Orders   Beta HCG, Quant   CBC   US Pelvis Complete   US Transvaginal Non-OB    Other Visit Diagnoses    Screening for cervical cancer    -  Primary   Relevant Orders   Cytology - PAP      Total face-to-face time with patient: 15 minutes. Over 50% of encounter was spent on counseling and coordination of care. Return if symptoms worsen or fail to improve.  Reva Boresanya S Davarion Cuffee 08/02/2016 9:33 AM

## 2016-08-02 NOTE — Patient Instructions (Signed)
Abnormal Uterine Bleeding Abnormal uterine bleeding means bleeding from the vagina that is not your normal menstrual period. This can be:  Bleeding or spotting between periods.  Bleeding after sex (sexual intercourse).  Bleeding that is heavier or more than normal.  Periods that last longer than usual.  Bleeding after menopause. There are many problems that may cause this. Treatment will depend on the cause of the bleeding. Any kind of bleeding that is not normal should be reviewed by your doctor. Follow these instructions at home: Watch your condition for any changes. These actions may lessen any discomfort you are having:  Do not use tampons or douches as told by your doctor.  Change your pads often. You should get regular pelvic exams and Pap tests. Keep all appointments for tests as told by your doctor. Contact a doctor if:  You are bleeding for more than 1 week.  You feel dizzy at times. Get help right away if:  You pass out.  You have to change pads every 15 to 30 minutes.  You have belly pain.  You have a fever.  You become sweaty or weak.  You are passing large blood clots from the vagina.  You feel sick to your stomach (nauseous) and throw up (vomit). This information is not intended to replace advice given to you by your health care provider. Make sure you discuss any questions you have with your health care provider. Document Released: 04/17/2009 Document Revised: 11/26/2015 Document Reviewed: 01/17/2013 Elsevier Interactive Patient Education  2017 Elsevier Inc.  

## 2016-08-03 ENCOUNTER — Telehealth: Payer: Self-pay | Admitting: *Deleted

## 2016-08-03 LAB — CBC
Hematocrit: 37.9 % (ref 34.0–46.6)
Hemoglobin: 11.8 g/dL (ref 11.1–15.9)
MCH: 27.2 pg (ref 26.6–33.0)
MCHC: 31.1 g/dL — ABNORMAL LOW (ref 31.5–35.7)
MCV: 87 fL (ref 79–97)
PLATELETS: 385 10*3/uL — AB (ref 150–379)
RBC: 4.34 x10E6/uL (ref 3.77–5.28)
RDW: 14.2 % (ref 12.3–15.4)
WBC: 6.5 10*3/uL (ref 3.4–10.8)

## 2016-08-03 LAB — BETA HCG QUANT (REF LAB): hCG Quant: 3 m[IU]/mL

## 2016-08-03 NOTE — Telephone Encounter (Signed)
-----   Message from Lindell SparHeather L Bacon, [place] sent at 08/03/2016 12:03 PM EST ----- Regarding[facility] Contact: [phone] Pt called stating that the hospital called [facility] ???  Please call her back on number provided above..   Thanks

## 2016-08-03 NOTE — Telephone Encounter (Signed)
Spoke to pt, informed her of D&C pathology results.  Pt once again asked about chromosome testing on the baby.  Informed her that due to the length of time that had passed that there would be no sample available and that Dr Alvester MorinNewton had discussed the problem with OR and pathology.  Offered to give her a contact to make a formal complaint, pt declined further info.  Pt concerned about her bleeding and does not understand why she is still bleeding.  Informed her that her BHCG was normal and that it could be hormonal related due to the recent pregnancy to try to give it another month or so and see if the bleeding resolved and if not to call back and schedule an appt for re-evaluation.

## 2016-08-04 LAB — CYTOLOGY - PAP
Diagnosis: NEGATIVE
HPV: NOT DETECTED

## 2016-08-08 ENCOUNTER — Ambulatory Visit (HOSPITAL_COMMUNITY): Admission: RE | Admit: 2016-08-08 | Source: Ambulatory Visit

## 2016-08-18 ENCOUNTER — Telehealth: Payer: Self-pay | Admitting: *Deleted

## 2016-08-18 NOTE — Telephone Encounter (Signed)
-----   Message from Lindell SparHeather L Bacon, [place] sent at 08/17/2016  1:18 PM EST ----- Regarding: Lab results Contact: [phone] Pt would like a call back for her Lab Results  thanks

## 2016-08-18 NOTE — Telephone Encounter (Signed)
Informed pt of normal pap result.

## 2017-03-24 ENCOUNTER — Encounter: Payer: Self-pay | Admitting: Emergency Medicine

## 2017-03-24 ENCOUNTER — Emergency Department
Admission: EM | Admit: 2017-03-24 | Discharge: 2017-03-24 | Disposition: A | Discharge status: Home / Self Care | Payer: Self-pay | Attending: Emergency Medicine | Admitting: Emergency Medicine

## 2017-03-24 ENCOUNTER — Emergency Department: Payer: Self-pay

## 2017-03-24 DIAGNOSIS — R110 Nausea: Secondary | ICD-10-CM | POA: Insufficient documentation

## 2017-03-24 DIAGNOSIS — N939 Abnormal uterine and vaginal bleeding, unspecified: Secondary | ICD-10-CM

## 2017-03-24 DIAGNOSIS — R102 Pelvic and perineal pain: Secondary | ICD-10-CM | POA: Insufficient documentation

## 2017-03-24 DIAGNOSIS — R1084 Generalized abdominal pain: Secondary | ICD-10-CM | POA: Insufficient documentation

## 2017-03-24 LAB — URINALYSIS, COMPLETE (UACMP) WITH MICROSCOPIC
BILIRUBIN URINE: NEGATIVE
Bacteria, UA: NONE SEEN
GLUCOSE, UA: NEGATIVE mg/dL
Ketones, ur: NEGATIVE mg/dL
LEUKOCYTES UA: NEGATIVE
NITRITE: NEGATIVE
PROTEIN: NEGATIVE mg/dL
Specific Gravity, Urine: 1.011 (ref 1.005–1.030)
Squamous Epithelial / LPF: NONE SEEN
WBC UA: NONE SEEN WBC/hpf (ref 0–5)
pH: 7 (ref 5.0–8.0)

## 2017-03-24 LAB — BASIC METABOLIC PANEL
ANION GAP: 8 (ref 5–15)
BUN: 13 mg/dL (ref 6–20)
CALCIUM: 8.9 mg/dL (ref 8.9–10.3)
CO2: 25 mmol/L (ref 22–32)
Chloride: 104 mmol/L (ref 101–111)
Creatinine, Ser: 0.85 mg/dL (ref 0.44–1.00)
GLUCOSE: 112 mg/dL — AB (ref 65–99)
POTASSIUM: 3.7 mmol/L (ref 3.5–5.1)
SODIUM: 137 mmol/L (ref 135–145)

## 2017-03-24 LAB — WET PREP, GENITAL
Clue Cells Wet Prep HPF POC: NONE SEEN
Sperm: NONE SEEN
Trich, Wet Prep: NONE SEEN
YEAST WET PREP: NONE SEEN

## 2017-03-24 LAB — CBC
HCT: 38 % (ref 35.0–47.0)
Hemoglobin: 12.9 g/dL (ref 12.0–16.0)
MCH: 27.7 pg (ref 26.0–34.0)
MCHC: 33.9 g/dL (ref 32.0–36.0)
MCV: 81.8 fL (ref 80.0–100.0)
PLATELETS: 324 10*3/uL (ref 150–440)
RBC: 4.64 MIL/uL (ref 3.80–5.20)
RDW: 15.9 % — AB (ref 11.5–14.5)
WBC: 8 10*3/uL (ref 3.6–11.0)

## 2017-03-24 LAB — CHLAMYDIA/NGC RT PCR (ARMC ONLY)
CHLAMYDIA TR: NOT DETECTED
N GONORRHOEAE: NOT DETECTED

## 2017-03-24 LAB — ABO/RH: ABO/RH(D): B POS

## 2017-03-24 LAB — HCG, QUANTITATIVE, PREGNANCY: hCG, Beta Chain, Quant, S: <1 m[IU]/mL (ref <5)

## 2017-03-24 MED ORDER — KETOROLAC TROMETHAMINE 10 MG PO TABS: 10.0000 mg | ORAL_TABLET | Freq: Three times a day (TID) | ORAL | 0 refills | Status: AC | PRN

## 2017-03-24 NOTE — ED Triage Notes (Addendum)
Pt states she thinks she had a miscarriage yesterday, pt reports she had vaginal bleeding and a large amount of tissue came out and c/o abdominal pain.  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  Pt denies any vaginal bleeding today.  Pt described the bleeding as a period.  Pt c/o lower abdominal pain radiating to the back. Pt states she is only about [redacted] weeks pregnant.  Pt has had at least 9 pregnancies.

## 2017-03-24 NOTE — ED Provider Notes (Signed)
[facility] Emergency Department Provider Note  ____________________________________________  Time seen: Approximately 3:06 PM  I have reviewed the triage vital signs and the nursing notes.   HISTORY  Chief Complaint Miscarriage and Vaginal Bleeding    HPI Lindsey Gillespie is a 46 y.o. female g9 P3 a 5, history of dysfunctional uterine bleeding, presenting w/ abnormal periods and pelvic cramping.  The patient reports that she had an abnormal "early" period last month.  Then last week, she began to notice symptoms of pregnancy including nausea and food aversions.  On Sunday, she developed about 1d of light vaginal bleeding, and had one episode of tissue passage from the vagina into the toilet, which she flushed. The bleeding has completely resolved, but over the past few days she has been experiencing abdominal cramping.  She denies any fever, dysuria, urinary frequency, lightheadedness or syncope, sob.  She called her OB-Gyn who recommended ED evaluation.  OB hx: multiple spontaneous miscarriage, tx for gonorrhea/chlamydia remotely; no hx of ectopic pregnancy   History reviewed. No pertinent past medical history.  Patient Active Problem List   Diagnosis Date Noted  . Abnormal uterine bleeding 08/02/2016  . 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 AMA (advanced maternal age) multigravida 35+ 06/15/2016  . Clinical depression 10/02/2015  . Calculus of kidney 04/08/2015  . Acid reflux 05/17/2013  . Awareness of heartbeats 05/17/2013    Past Surgical History:  Procedure Laterality Date  . CESAREAN SECTION    . DILATION AND CURETTAGE OF UTERUS  2013  . DILATION AND EVACUATION N/A 06/18/2016   Procedure: DILATATION AND EVACUATION;  Surgeon: Adam Phenix, MD;  Location: [facility];  Service: Gynecology;  Laterality: N/A;  . miscarriage     MULTIPLE      Allergies Metronidazole and Tape  Family History  Problem Relation Age of Onset  . Hypertension  Mother   . Cancer Father   . Hematuria Neg Hx     Social History Social History  Substance Use Topics  . Smoking status: Never Smoker  . Smokeless tobacco: Never Used  . Alcohol use No    Review of Systems Constitutional: No fever/chills.positive food eversions. Eyes: No visual changes. ENT: No sore throat. No congestion or rhinorrhea. Cardiovascular: Denies chest pain. Denies palpitations. Respiratory: Denies shortness of breath.  No cough. Gastrointestinal: No abdominal pain.  Positivenausea, no vomiting.  No diarrhea.  No constipation. Genitourinary: Negative for dysuria.positive for vaginal bleeding, now resolved.  Positive for passage of tissue from the vagina. Positive for pelvic cramping. Musculoskeletal: Negative for back pain. Skin: Negative for rash. Neurological: Negative for headaches. No focal numbness, tingling or weakness.     ____________________________________________   PHYSICAL EXAM:  VITAL SIGNS: ED Triage Vitals  Enc Vitals Group     BP 03/24/17 1330 124/71     Pulse Rate 03/24/17 1330 86     Resp 03/24/17 1330 18     Temp 03/24/17 1330 98.6 F (37 C)     Temp Source 03/24/17 1330 Oral     SpO2 03/24/17 1330 98 %     Weight 03/24/17 1331 200 lb (90.7 kg)     Height 03/24/17 1331  (1.626 m)     Head Circumference --      Peak Flow --      Pain Score 03/24/17 1330 5     Pain Loc --      Pain Edu? --      Excl. in GC? --  Constitutional: Alert and oriented. Well appearing and in no acute distress. Answers questions appropriately. Eyes: Conjunctivae are normal.  EOMI. No scleral icterus. Head: Atraumatic. Nose: No congestion/rhinnorhea. Mouth/Throat: Mucous membranes are moist.  Neck: No stridor.  Supple.   Cardiovascular: Normal rate, regular rhythm. No murmurs, rubs or gallops.  Respiratory: Normal respiratory effort.  No accessory muscle use or retractions. Lungs CTAB.  No wheezes, rales or ronchi. Gastrointestinal: Soft, and  nondistended.  Minimal suprapubic discomfort w/ palpation. No guarding or rebound.  No peritoneal signs. Genitourinary: Normal-appearing external genitalia without lesions. Normal vaginal exam with physiologic discharge, normal-appearing cervix, normal vaginal wall tissue. Bimanual exam is negative for CMT, adnexal tenderness to palpation, no palpable masses. Musculoskeletal: No LE edema.  Neurologic:  A&Ox3.  Speech is clear.  Face and smile are symmetric.  EOMI.  Moves all extremities well. Skin:  Skin is warm, dry and intact. No rash noted. Psychiatric: Mood and affect are normal. Speech and behavior are normal.  Normal judgement.  ____________________________________________   LABS (all labs ordered are listed, but only abnormal results are displayed)  Labs Reviewed  WET PREP, GENITAL - Abnormal; Notable for the following:       Result Value   WBC, Wet Prep HPF POC MODERATE (*)    All other components within normal limits  CBC - Abnormal; Notable for the following:    RDW 15.9 (*)    All other components within normal limits  BASIC METABOLIC PANEL - Abnormal; Notable for the following:    Glucose, Bld 112 (*)    All other components within normal limits  URINALYSIS, COMPLETE (UACMP) WITH MICROSCOPIC - Abnormal; Notable for the following:    Color, Urine STRAW (*)    APPearance CLEAR (*)    Hgb urine dipstick MODERATE (*)    All other components within normal limits  CHLAMYDIA/NGC RT PCR ([facility] ONLY)  HCG, QUANTITATIVE, PREGNANCY  ABO/RH   ____________________________________________  EKG  Not indicated ____________________________________________  RADIOLOGY  US Pelvic Complete With Transvaginal  Result Date: 03/24/2017 CLINICAL DATA:  Heavy vaginal bleeding. EXAM: TRANSABDOMINAL AND TRANSVAGINAL ULTRASOUND OF PELVIS TECHNIQUE: Both transabdominal and transvaginal ultrasound examinations of the pelvis were performed. Transabdominal technique was performed for  global imaging of the pelvis including uterus, ovaries, adnexal regions, and pelvic cul-de-sac. It was necessary to proceed with endovaginal exam following the transabdominal exam to visualize the endometrium. COMPARISON:  None FINDINGS: Uterus Measurements: 8.5 x 5.8 x 4.8 cm. No fibroids or other mass visualized. Endometrium Thickness: 10 mm which is within normal limits for patient of reproductive age. No focal abnormality visualized. Right ovary Measurements: 2.7 x 2.0 x 1.6 cm. Normal appearance/no adnexal mass. Left ovary Not visualized. No definite abnormality seen in left adnexal region. Other findings No abnormal free fluid. IMPRESSION: Left ovary is not visualized. No definite abnormality is seen in the pelvis. Electronically Signed   By: Lupita Raider, M.D.   On: 03/24/2017 17:22    ____________________________________________   PROCEDURES  Procedure(s) performed: None  Procedures  Critical Care performed: No ____________________________________________   INITIAL IMPRESSION / ASSESSMENT AND PLAN / ED COURSE  Pertinent labs & imaging results that were available during my care of the patient were reviewed by me and considered in my medical decision making (see chart for details).  46 y.o. [id number] presenting w/ abnormal period, lower abdominal cramping, passage of tissue from the vagina 6d ago.  Here, the patient's hCG is less than 1; there is no evidence for pregnancy  today. It is possible the patient passed a decidual cyst, or had some other passage of mucus from the vagina. We'll do a full pelvic exam, including cultures for further evaluation. Also get a urine test to evaluate for UTI. Plan re-evaluation for final disposition.  ----------------------------------------- 6:09 PM on 03/24/2017 -----------------------------------------  The patient's workup in the emergency department has been reassuring. She does not have any evidence of infection in the vagina. She does not have  a UTI. Her blood counts are stable. Her pelvic ultrasound does not show any acute abnormality; although the left ovary is not visualized. She understands the limitation about this study. At this time, the patient is feeling significantly better and is safe for discharge home. Return precautions as well as follow-up instructions were discussed.  ____________________________________________  FINAL CLINICAL IMPRESSION(S) / ED DIAGNOSES  Final diagnoses:  Pelvic cramping  Abnormal uterine bleeding         NEW MEDICATIONS STARTED DURING THIS VISIT:  New Prescriptions   KETOROLAC (TORADOL) 10 MG TABLET    Take 1 tablet (10 mg total) by mouth every 8 (eight) hours as needed for moderate pain (with food).      Rockne Menghini, MD 03/24/17 [phone]

## 2017-03-24 NOTE — Discharge Instructions (Signed)
Please return to the emergency department for severe pain, fever, inability to keep down liquids, or for any other symptoms concerning to you.

## 2018-02-18 IMAGING — US US OB COMP LESS 14 WK
1 series · 14 of 28 positions shown · non-contrast
Comparison: None.

CLINICAL DATA: 45-year-old female with pelvic pain and cramping for
1 day, possible fetal demise. No fetal heart tones on exam
yesterday. Quantitative beta HCG [DATE].

EXAM:
OBSTETRIC <14 WK ULTRASOUND
TECHNIQUE: Transabdominal ultrasound was performed for evaluation of the
gestation as well as the maternal uterus and adnexal regions.

[Series 1: us ob comp less 14 wk · 0.20mm/px · 35 acquisitions, 14 frames shown]
[im 2/35]
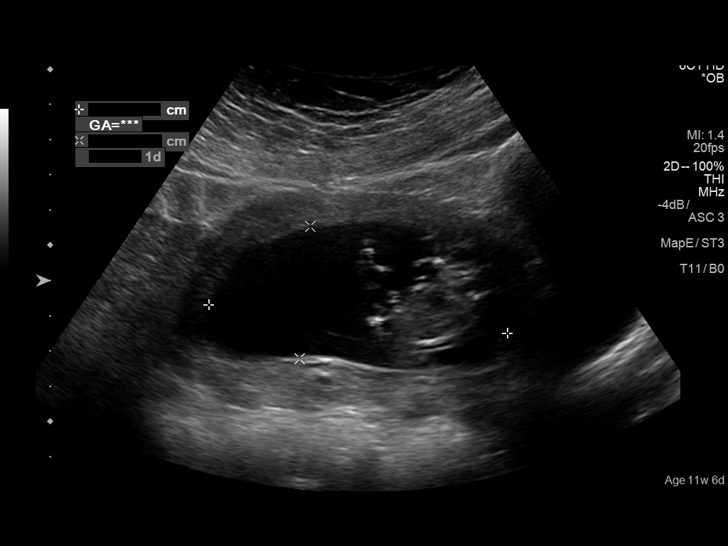
[im 4/35]
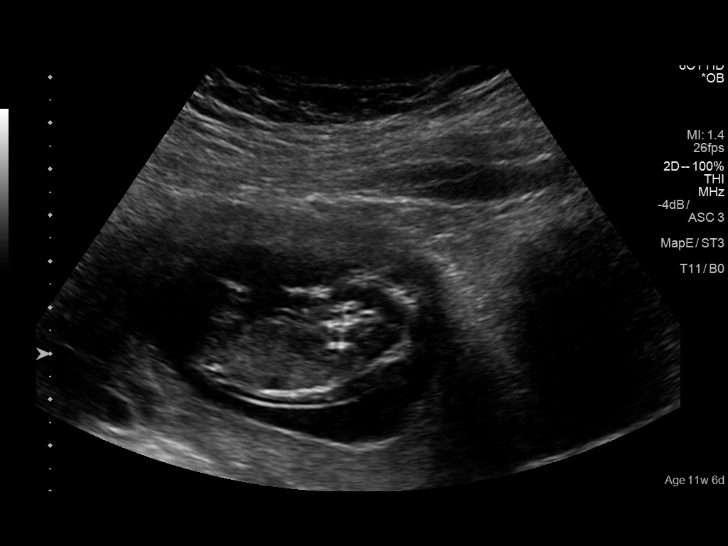
[im 7/35]
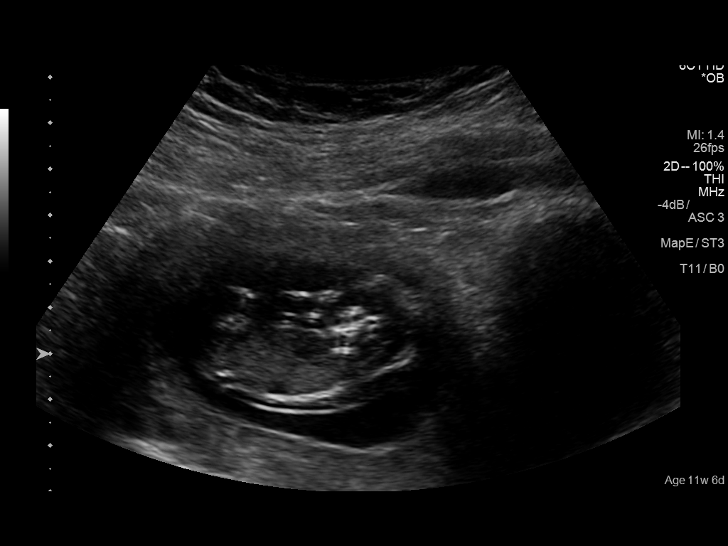
[im 9/35]
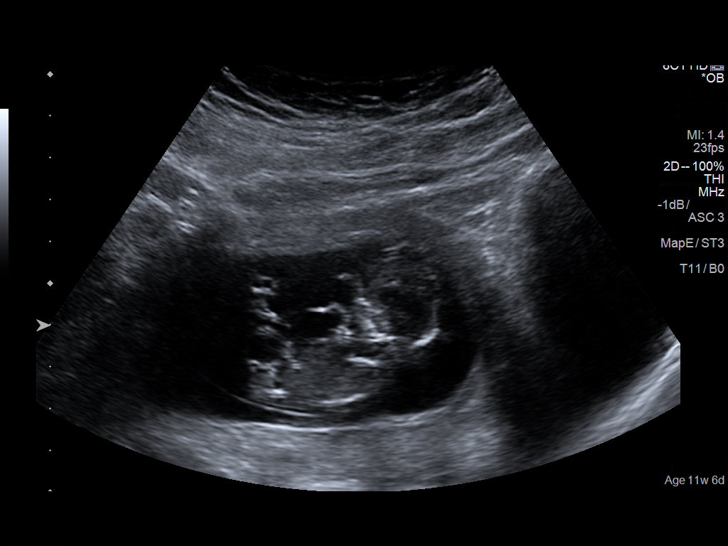
[im 12/35]
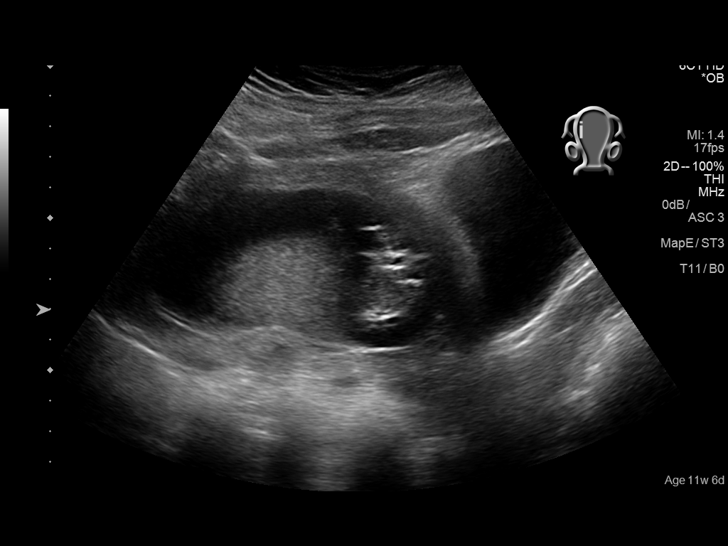
[im 14/35]
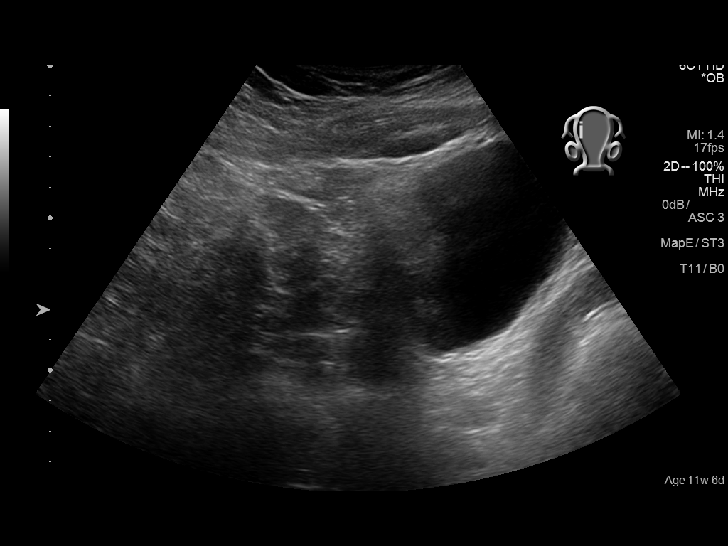
[im 17/35]
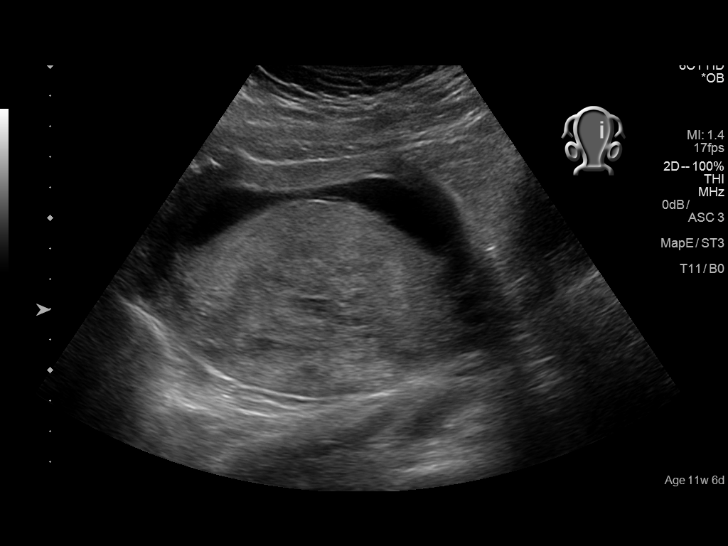
[im 19/35]
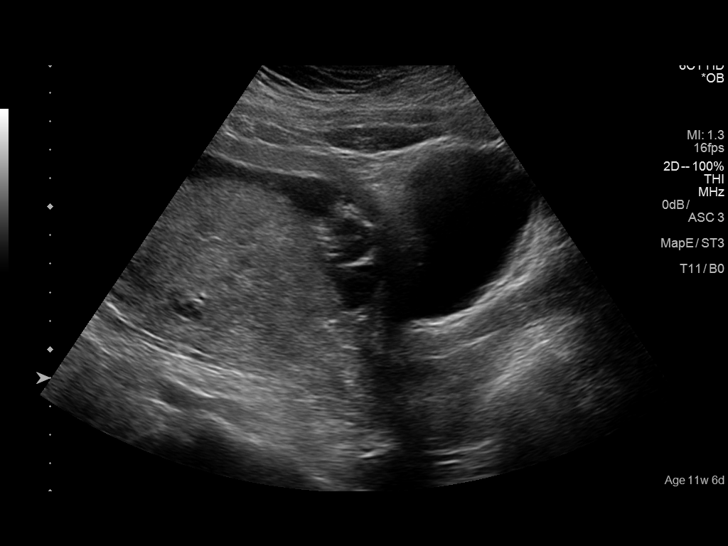
[im 22/35]
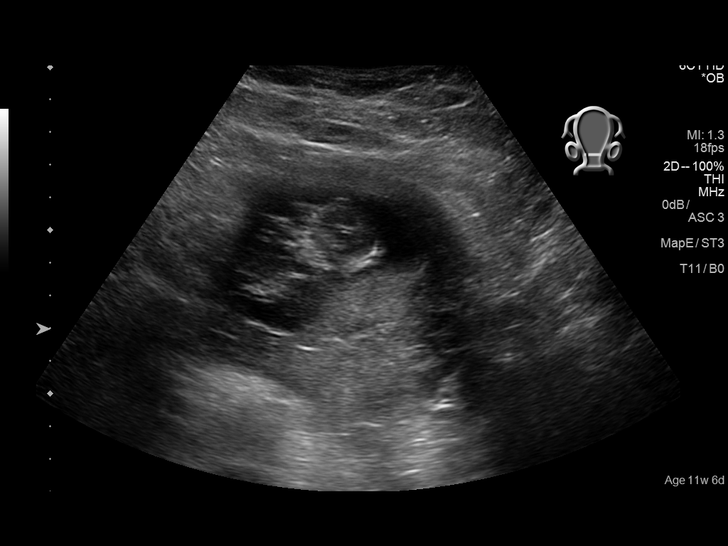
[im 24/35]
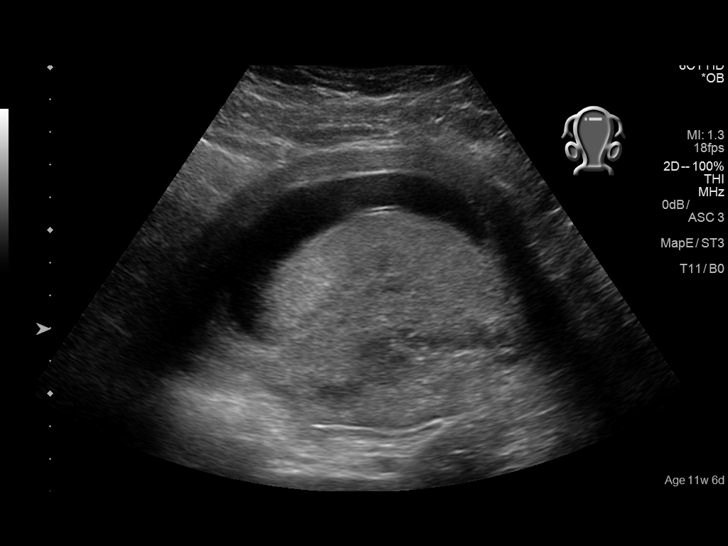
[im 27/35]
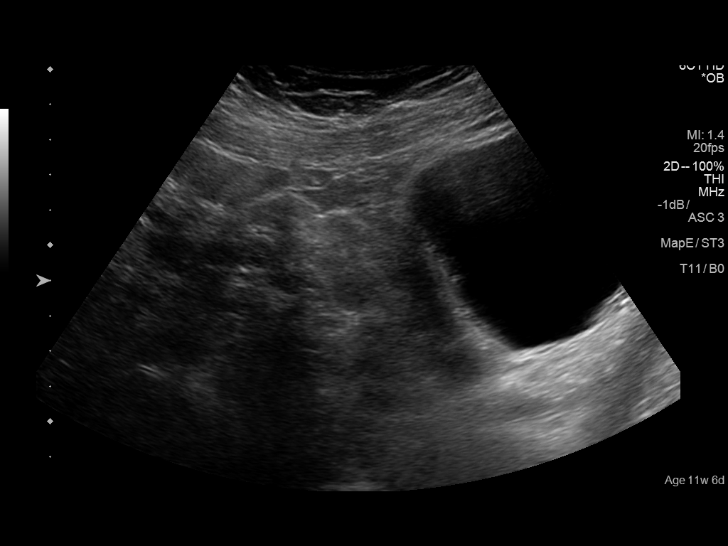
[im 29/35]
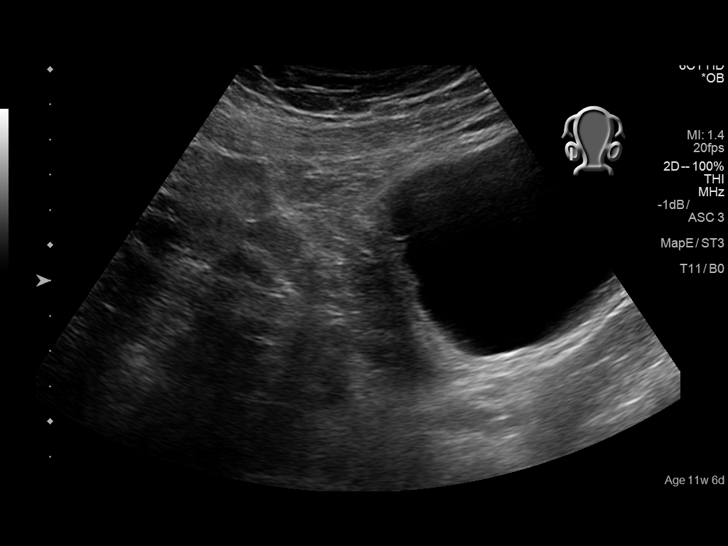
[im 32/35]
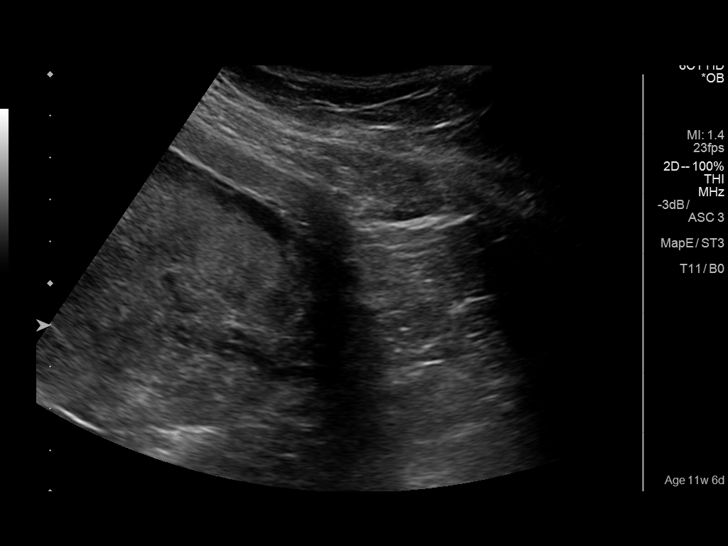
[im 35/35]
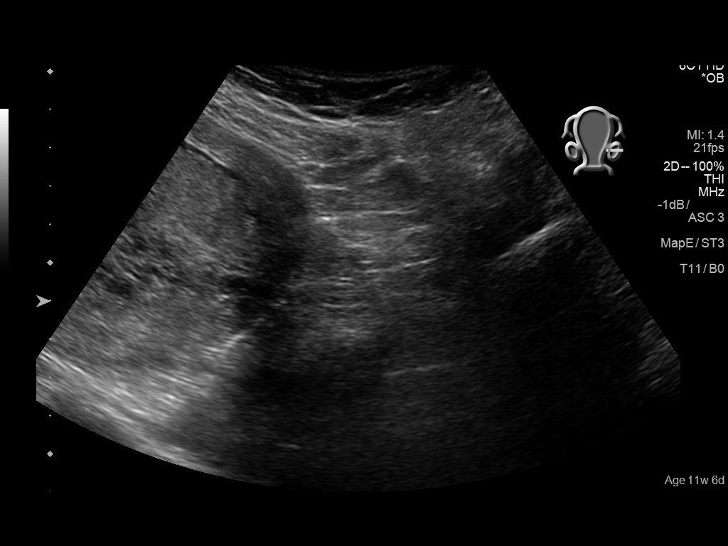

[14 of 28 positions shown; findings below may reference images not displayed]

FINDINGS: Intrauterine gestational sac: Single

Yolk sac:  Not visible

Embryo:  Visible

Cardiac Activity: None detected (image 11)

Heart Rate: Not applicable bpm

CRL:   49.9  mm   11 w 5 d

Subchorionic hemorrhage:  None visualized.

Maternal uterus/adnexae: No pelvic free fluid. Neither ovary is
identified.
IMPRESSION: Crown-rump length of 49.9 mm with no cardiac activity detectable.
Findings meet definitive criteria for failed pregnancy. This follows
SRU consensus guidelines: Diagnostic Criteria for Nonviable
Pregnancy Early in the First Trimester. N Engl J Med

## 2018-11-26 IMAGING — US US PELVIS COMPLETE TRANSABD/TRANSVAG
1 series · 14 of 25 positions shown · non-contrast
Comparison: None

CLINICAL DATA: Heavy vaginal bleeding.



[Series 1: us pelvis complete transabd/transvag · 0.25mm/px · 14 of 93 slices shown]
[im 1/93]
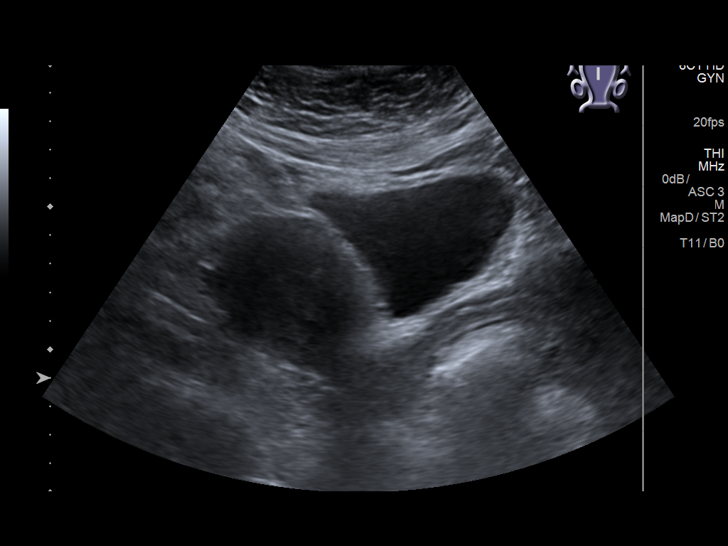
[im 8/93]
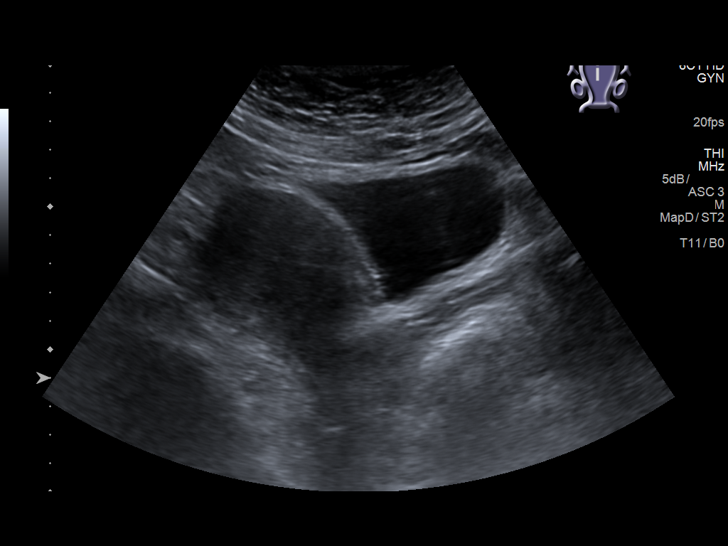
[im 16/93]
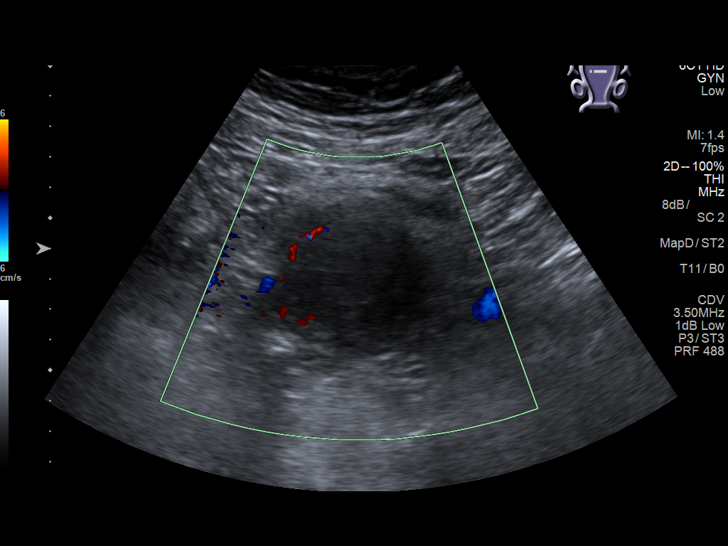
[im 24/93]
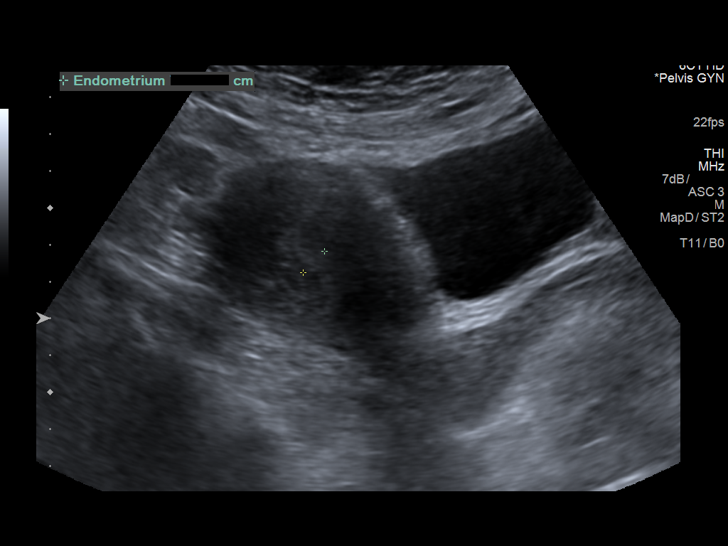
[im 31/93]
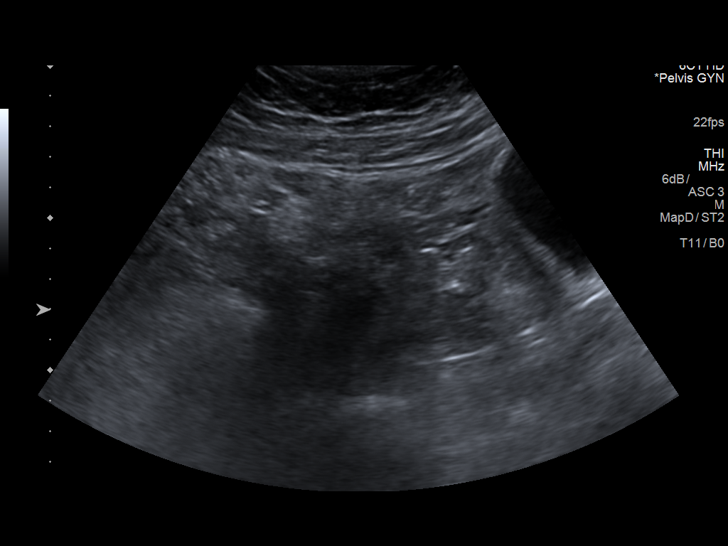
[im 35/93]
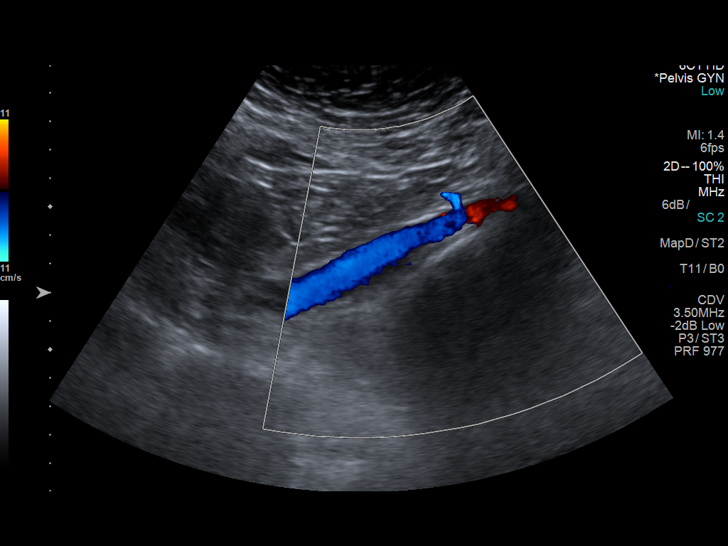
[im 43/93]
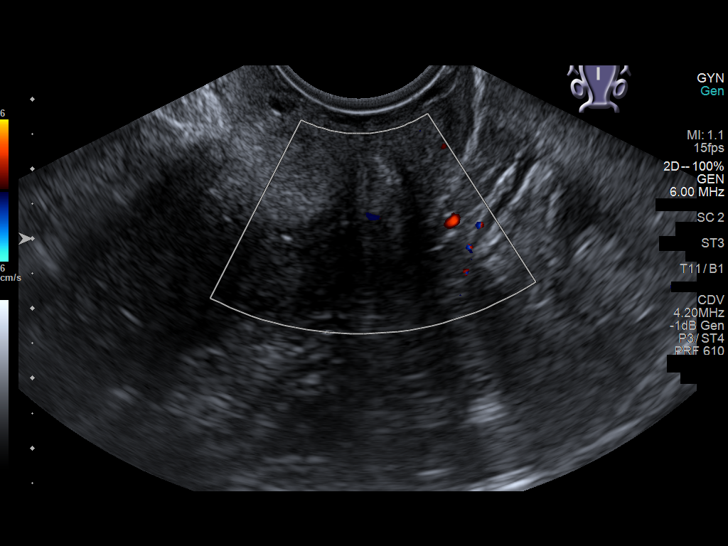
[im 50/93]
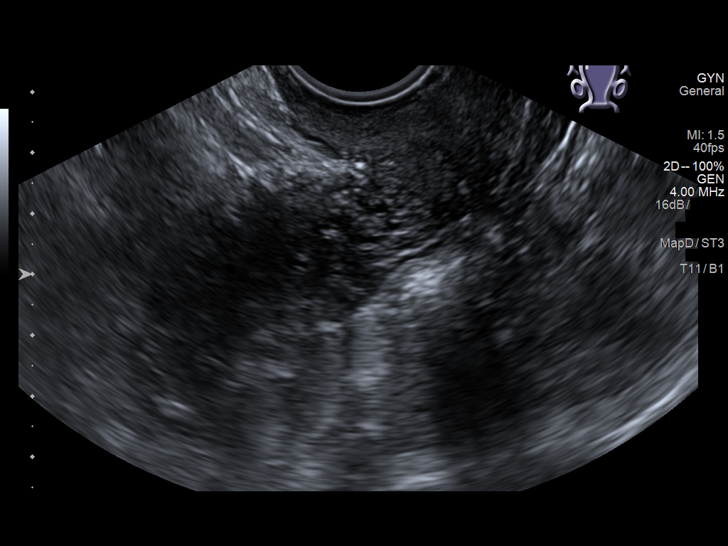
[im 58/93]
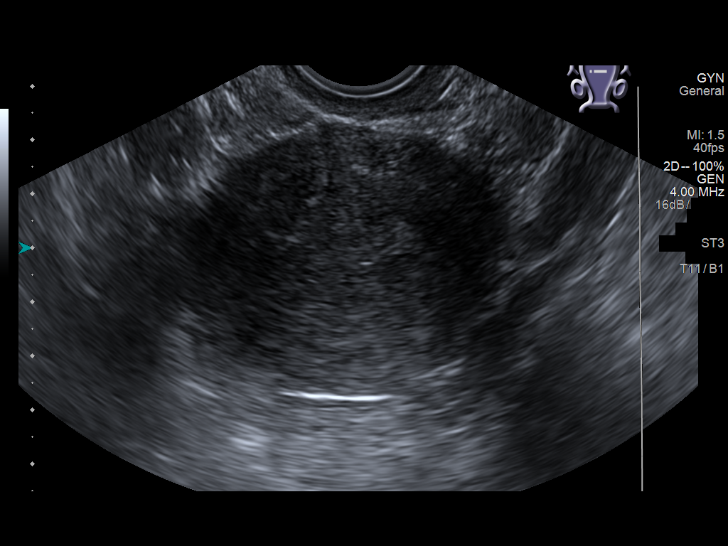
[im 62/93]
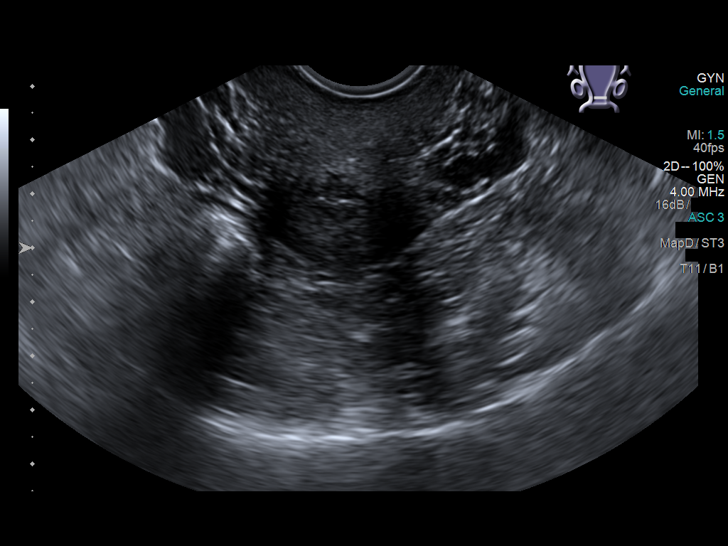
[im 70/93]
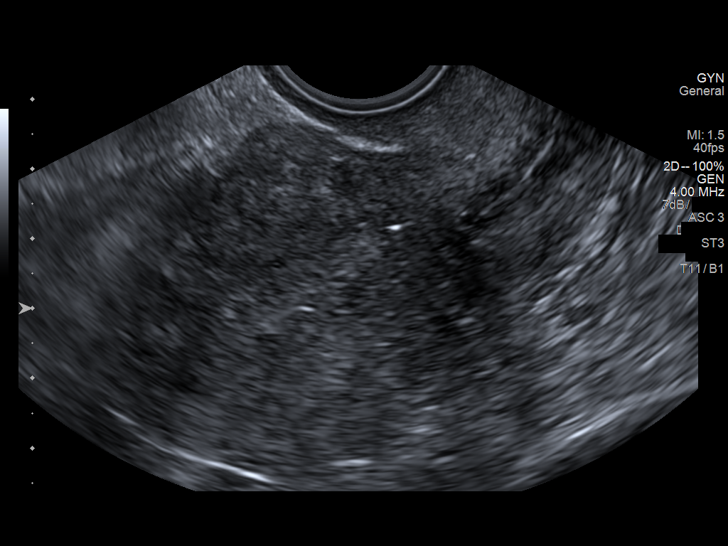
[im 77/93]
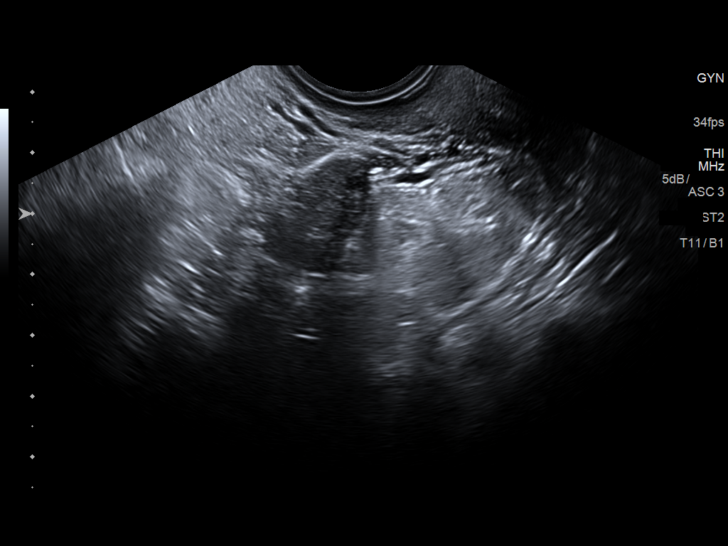
[im 85/93]
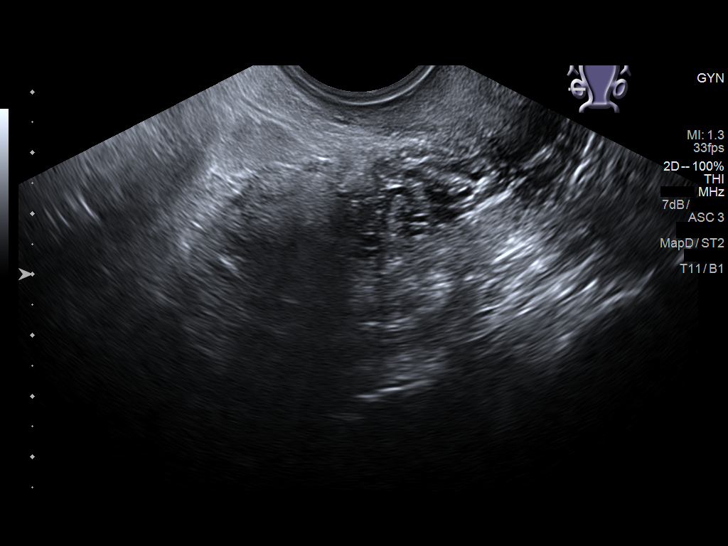
[im 93/93]
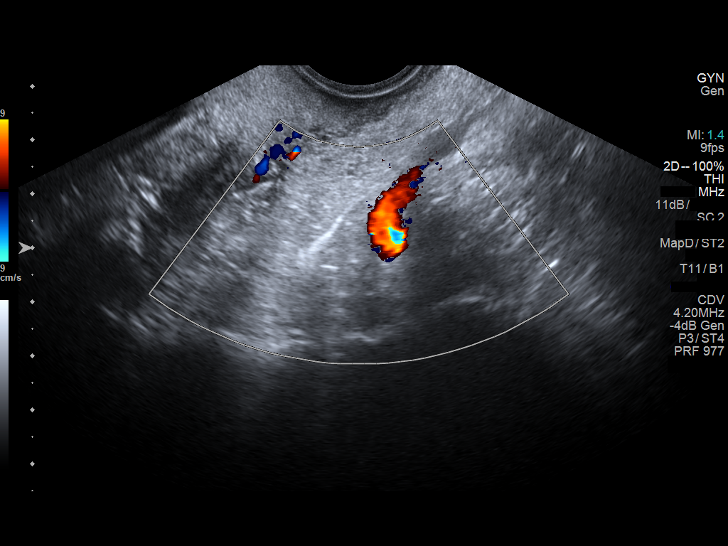

[14 of 25 positions shown; findings below may reference images not displayed]

FINDINGS: Uterus

Measurements: 8.5 x 5.8 x 4.8 cm. No fibroids or other mass
visualized.

Endometrium

Thickness: 10 mm which is within normal limits for patient of
reproductive age. No focal abnormality visualized.

Right ovary

Measurements: 2.7 x 2.0 x 1.6 cm. Normal appearance/no adnexal mass.

Left ovary

Not visualized. No definite abnormality seen in left adnexal region.

Other findings

No abnormal free fluid.
IMPRESSION: Left ovary is not visualized. No definite abnormality is seen in the
pelvis.
